# Patient Record
Sex: Female | Born: 1945 | Race: White | Hispanic: No | Marital: Single | Smoking: Never smoker
Health system: Southern US, Community
[De-identification: ages and names within clinical notes are randomized; demographics above are authoritative.]

## PROBLEM LIST (undated history)

## (undated) DIAGNOSIS — F1020 Alcohol dependence, uncomplicated: Secondary | ICD-10-CM

## (undated) DIAGNOSIS — J449 Chronic obstructive pulmonary disease, unspecified: Secondary | ICD-10-CM

## (undated) DIAGNOSIS — I10 Essential (primary) hypertension: Secondary | ICD-10-CM

## (undated) HISTORY — PX: LUNG LOBECTOMY: SHX167

---

## 2021-10-24 ENCOUNTER — Inpatient Hospital Stay (HOSPITAL_BASED_OUTPATIENT_CLINIC_OR_DEPARTMENT_OTHER)
Admission: EM | Admit: 2021-10-24 | Discharge: 2021-10-28 | DRG: 194 | Disposition: A | Discharge status: Home / Self Care | Payer: Medicare Other | Attending: Internal Medicine | Admitting: Internal Medicine

## 2021-10-24 ENCOUNTER — Other Ambulatory Visit: Payer: Self-pay

## 2021-10-24 ENCOUNTER — Emergency Department (HOSPITAL_BASED_OUTPATIENT_CLINIC_OR_DEPARTMENT_OTHER): Payer: Medicare Other

## 2021-10-24 ENCOUNTER — Encounter (HOSPITAL_BASED_OUTPATIENT_CLINIC_OR_DEPARTMENT_OTHER): Payer: Self-pay | Admitting: Emergency Medicine

## 2021-10-24 DIAGNOSIS — E860 Dehydration: Secondary | ICD-10-CM | POA: Diagnosis present

## 2021-10-24 DIAGNOSIS — E785 Hyperlipidemia, unspecified: Secondary | ICD-10-CM | POA: Diagnosis present

## 2021-10-24 DIAGNOSIS — E8729 Other acidosis: Secondary | ICD-10-CM | POA: Diagnosis present

## 2021-10-24 DIAGNOSIS — E876 Hypokalemia: Secondary | ICD-10-CM | POA: Diagnosis present

## 2021-10-24 DIAGNOSIS — R7401 Elevation of levels of liver transaminase levels: Secondary | ICD-10-CM

## 2021-10-24 DIAGNOSIS — R17 Unspecified jaundice: Secondary | ICD-10-CM

## 2021-10-24 DIAGNOSIS — R251 Tremor, unspecified: Secondary | ICD-10-CM | POA: Diagnosis present

## 2021-10-24 DIAGNOSIS — J189 Pneumonia, unspecified organism: Secondary | ICD-10-CM | POA: Diagnosis not present

## 2021-10-24 DIAGNOSIS — F39 Unspecified mood [affective] disorder: Secondary | ICD-10-CM | POA: Diagnosis present

## 2021-10-24 DIAGNOSIS — Z6823 Body mass index (BMI) 23.0-23.9, adult: Secondary | ICD-10-CM

## 2021-10-24 DIAGNOSIS — W19XXXA Unspecified fall, initial encounter: Secondary | ICD-10-CM

## 2021-10-24 DIAGNOSIS — F1010 Alcohol abuse, uncomplicated: Secondary | ICD-10-CM

## 2021-10-24 DIAGNOSIS — Z20822 Contact with and (suspected) exposure to covid-19: Secondary | ICD-10-CM | POA: Diagnosis present

## 2021-10-24 DIAGNOSIS — I10 Essential (primary) hypertension: Secondary | ICD-10-CM | POA: Diagnosis present

## 2021-10-24 DIAGNOSIS — Z885 Allergy status to narcotic agent status: Secondary | ICD-10-CM

## 2021-10-24 DIAGNOSIS — R0600 Dyspnea, unspecified: Secondary | ICD-10-CM | POA: Diagnosis present

## 2021-10-24 DIAGNOSIS — F1020 Alcohol dependence, uncomplicated: Secondary | ICD-10-CM | POA: Diagnosis present

## 2021-10-24 DIAGNOSIS — S0083XA Contusion of other part of head, initial encounter: Secondary | ICD-10-CM | POA: Diagnosis present

## 2021-10-24 DIAGNOSIS — Y900 Blood alcohol level of less than 20 mg/100 ml: Secondary | ICD-10-CM | POA: Diagnosis present

## 2021-10-24 DIAGNOSIS — R627 Adult failure to thrive: Secondary | ICD-10-CM | POA: Diagnosis present

## 2021-10-24 DIAGNOSIS — F10229 Alcohol dependence with intoxication, unspecified: Secondary | ICD-10-CM | POA: Diagnosis present

## 2021-10-24 DIAGNOSIS — Q2546 Tortuous aortic arch: Secondary | ICD-10-CM

## 2021-10-24 DIAGNOSIS — E538 Deficiency of other specified B group vitamins: Secondary | ICD-10-CM | POA: Diagnosis present

## 2021-10-24 DIAGNOSIS — W010XXA Fall on same level from slipping, tripping and stumbling without subsequent striking against object, initial encounter: Secondary | ICD-10-CM | POA: Diagnosis present

## 2021-10-24 DIAGNOSIS — D696 Thrombocytopenia, unspecified: Secondary | ICD-10-CM | POA: Diagnosis present

## 2021-10-24 DIAGNOSIS — Z902 Acquired absence of lung [part of]: Secondary | ICD-10-CM

## 2021-10-24 DIAGNOSIS — R0602 Shortness of breath: Secondary | ICD-10-CM

## 2021-10-24 DIAGNOSIS — E872 Acidosis, unspecified: Secondary | ICD-10-CM

## 2021-10-24 DIAGNOSIS — J440 Chronic obstructive pulmonary disease with acute lower respiratory infection: Secondary | ICD-10-CM | POA: Diagnosis present

## 2021-10-24 HISTORY — DX: Alcohol dependence, uncomplicated: F10.20

## 2021-10-24 HISTORY — DX: Essential (primary) hypertension: I10

## 2021-10-24 HISTORY — DX: Chronic obstructive pulmonary disease, unspecified: J44.9

## 2021-10-24 LAB — I-STAT VENOUS BLOOD GAS, ED
Acid-base deficit: 1 mmol/L (ref 0.0–2.0)
Bicarbonate: 19.2 mmol/L — ABNORMAL LOW (ref 20.0–28.0)
Calcium, Ion: 1.04 mmol/L — ABNORMAL LOW (ref 1.15–1.40)
HCT: 36 % (ref 36.0–46.0)
Hemoglobin: 12.2 g/dL (ref 12.0–15.0)
O2 Saturation: 98 %
Patient temperature: 97.8
Potassium: 3.1 mmol/L — ABNORMAL LOW (ref 3.5–5.1)
Sodium: 137 mmol/L (ref 135–145)
TCO2: 20 mmol/L — ABNORMAL LOW (ref 22–32)
pCO2, Ven: 20.3 mmHg — ABNORMAL LOW (ref 44.0–60.0)
pH, Ven: 7.582 — ABNORMAL HIGH (ref 7.250–7.430)
pO2, Ven: 85 mmHg — ABNORMAL HIGH (ref 32.0–45.0)

## 2021-10-24 LAB — URINALYSIS, MICROSCOPIC (REFLEX)

## 2021-10-24 LAB — COMPREHENSIVE METABOLIC PANEL
ALT: 36 U/L (ref 0–44)
AST: 125 U/L — ABNORMAL HIGH (ref 15–41)
Albumin: 3.9 g/dL (ref 3.5–5.0)
Alkaline Phosphatase: 223 U/L — ABNORMAL HIGH (ref 38–126)
Anion gap: 23 — ABNORMAL HIGH (ref 5–15)
BUN: 11 mg/dL (ref 8–23)
CO2: 17 mmol/L — ABNORMAL LOW (ref 22–32)
Calcium: 9 mg/dL (ref 8.9–10.3)
Chloride: 99 mmol/L (ref 98–111)
Creatinine, Ser: 0.51 mg/dL (ref 0.44–1.00)
GFR, Estimated: >60 mL/min (ref >60)
Glucose, Bld: 109 mg/dL — ABNORMAL HIGH (ref 70–99)
Potassium: 3.1 mmol/L — ABNORMAL LOW (ref 3.5–5.1)
Sodium: 139 mmol/L (ref 135–145)
Total Bilirubin: 2.6 mg/dL — ABNORMAL HIGH (ref 0.3–1.2)
Total Protein: 7.1 g/dL (ref 6.5–8.1)

## 2021-10-24 LAB — RESP PANEL BY RT-PCR (FLU A&B, COVID) ARPGX2
Influenza A by PCR: NEGATIVE
Influenza B by PCR: NEGATIVE
SARS Coronavirus 2 by RT PCR: NEGATIVE

## 2021-10-24 LAB — CBC WITH DIFFERENTIAL/PLATELET
Abs Immature Granulocytes: 0.03 10*3/uL (ref 0.00–0.07)
Basophils Absolute: 0 10*3/uL (ref 0.0–0.1)
Basophils Relative: 1 %
Eosinophils Absolute: 0 10*3/uL (ref 0.0–0.5)
Eosinophils Relative: 0 %
HCT: 37.3 % (ref 36.0–46.0)
Hemoglobin: 13.2 g/dL (ref 12.0–15.0)
Immature Granulocytes: 0 %
Lymphocytes Relative: 16 %
Lymphs Abs: 1.1 10*3/uL (ref 0.7–4.0)
MCH: 34 pg (ref 26.0–34.0)
MCHC: 35.4 g/dL (ref 30.0–36.0)
MCV: 96.1 fL (ref 80.0–100.0)
Monocytes Absolute: 0.3 10*3/uL (ref 0.1–1.0)
Monocytes Relative: 4 %
Neutro Abs: 5.6 10*3/uL (ref 1.7–7.7)
Neutrophils Relative %: 79 %
Platelets: 132 10*3/uL — ABNORMAL LOW (ref 150–400)
RBC: 3.88 MIL/uL (ref 3.87–5.11)
RDW: 12.6 % (ref 11.5–15.5)
WBC: 7.1 10*3/uL (ref 4.0–10.5)
nRBC: 0 % (ref 0.0–0.2)

## 2021-10-24 LAB — URINALYSIS, ROUTINE W REFLEX MICROSCOPIC
Bilirubin Urine: NEGATIVE
Glucose, UA: NEGATIVE mg/dL
Hgb urine dipstick: NEGATIVE
Ketones, ur: >=80 mg/dL — AB
Nitrite: NEGATIVE
Protein, ur: NEGATIVE mg/dL
Specific Gravity, Urine: >=1.03 (ref 1.005–1.030)
pH: 5.5 (ref 5.0–8.0)

## 2021-10-24 LAB — TROPONIN I (HIGH SENSITIVITY)
Troponin I (High Sensitivity): 5 ng/L (ref <18)
Troponin I (High Sensitivity): 7 ng/L (ref <18)

## 2021-10-24 LAB — LACTIC ACID, PLASMA
Lactic Acid, Venous: 6.1 mmol/L (ref 0.5–1.9)
Lactic Acid, Venous: 3.8 mmol/L (ref 0.5–1.9)

## 2021-10-24 LAB — BASIC METABOLIC PANEL
Anion gap: 17 — ABNORMAL HIGH (ref 5–15)
BUN: 10 mg/dL (ref 8–23)
CO2: 19 mmol/L — ABNORMAL LOW (ref 22–32)
Calcium: 8.8 mg/dL — ABNORMAL LOW (ref 8.9–10.3)
Chloride: 101 mmol/L (ref 98–111)
Creatinine, Ser: 0.5 mg/dL (ref 0.44–1.00)
GFR, Estimated: >60 mL/min (ref >60)
Glucose, Bld: 131 mg/dL — ABNORMAL HIGH (ref 70–99)
Potassium: 3.1 mmol/L — ABNORMAL LOW (ref 3.5–5.1)
Sodium: 137 mmol/L (ref 135–145)

## 2021-10-24 LAB — PROTIME-INR
INR: 1.1 (ref 0.8–1.2)
Prothrombin Time: 14 seconds (ref 11.4–15.2)

## 2021-10-24 LAB — ETHANOL: Alcohol, Ethyl (B): <10 mg/dL (ref <10)

## 2021-10-24 MED ORDER — ONDANSETRON HCL 4 MG/2ML IJ SOLN
INTRAMUSCULAR | Status: AC
  Filled 2021-10-24: qty 2

## 2021-10-24 MED ORDER — LACTATED RINGERS IV BOLUS
1000.0000 mL | Freq: Once | INTRAVENOUS | Status: AC
  Administered 2021-10-24: 1000 mL via INTRAVENOUS

## 2021-10-24 MED ORDER — ONDANSETRON HCL 4 MG/2ML IJ SOLN
4.0000 mg | Freq: Once | INTRAMUSCULAR | Status: AC
  Administered 2021-10-24: 4 mg via INTRAVENOUS

## 2021-10-24 MED ORDER — OXYCODONE-ACETAMINOPHEN 5-325 MG PO TABS
1.0000 | ORAL_TABLET | Freq: Once | ORAL | Status: AC
  Administered 2021-10-24: 1 via ORAL
  Filled 2021-10-24: qty 1

## 2021-10-24 MED ORDER — SODIUM CHLORIDE 0.9 % IV SOLN
500.0000 mg | Freq: Once | INTRAVENOUS | Status: AC
  Administered 2021-10-24: 500 mg via INTRAVENOUS
  Filled 2021-10-24: qty 500

## 2021-10-24 MED ORDER — SODIUM CHLORIDE 0.9 % IV SOLN
1.0000 g | Freq: Once | INTRAVENOUS | Status: AC
  Administered 2021-10-24: 1 g via INTRAVENOUS
  Filled 2021-10-24: qty 10

## 2021-10-24 MED ORDER — LORAZEPAM 2 MG/ML IJ SOLN
1.0000 mg | Freq: Once | INTRAMUSCULAR | Status: AC
  Administered 2021-10-24: 1 mg via INTRAVENOUS
  Filled 2021-10-24: qty 1

## 2021-10-24 NOTE — Progress Notes (Signed)
Patient states that she has an inhaler at home but that she has never used it and does not know what kind of inhaler it is.

## 2021-10-24 NOTE — ED Notes (Signed)
Pt transported to CT ?

## 2021-10-24 NOTE — ED Provider Notes (Signed)
[facility] EMERGENCY DEPARTMENT Provider Note   CSN: [id number] Arrival date & time: 10/24/21  1834     History Chief Complaint  Patient presents with   Chest Pain   Fall   Shortness of Breath    Brittany Randolph is a 75 y.o. female.  HPI     75 year old female with a history of COPD, hypertension, presents with concern for fall 2 weeks ago, with continued right-sided chest pain, and fatigue and decreased appetite.  Reports that she tripped in her bedroom slipper just before Thanksgiving and fell landing on her right side.  Denies any loss of consciousness or other injuries other than bruising to her face.  She had pain to the right side of her ribs since the fall.  Denies shortness of breath, reports food allergy cough.  Denies known fevers or chills.  Reports has not been eating  because of pain to the right side.  Family concerned that she had not been eating, not moving around.  Typically exercises but has not had classes this week and not sure she would want to go otherwise.  Reports that prior to coming to the emergency department she had not had headache, nausea or vomiting, but on upon arrival developed vomiting.  She is not having chest pain other than the chest pain on the right side.  She denies any lightheadedness or palpitations.  No syncope.  Denies history of known liver disease.  Reports that she does drink alcohol daily, she states 3 drinks a day, family feels it is likely more than that, and that she starts pretty early.  She she denies having prior withdrawal, family reports she does have withdrawal symptoms if she does not drink.  Denies takin Presenter, broadcasting aspirin/goody powders.  Past Medical History:  Diagnosis Date   COPD (chronic obstructive pulmonary disease) ([place])    Hypertension     Patient Active Problem List   Diagnosis Date Noted   CAP (community acquired pneumonia) 10/24/2021    Past Surgical History:  Procedure Laterality Date   LUNG  LOBECTOMY Right      OB History   No obstetric history on file.     History reviewed. No pertinent family history.  Social History   Tobacco Use   Smoking status: Never   Smokeless tobacco: Never    Home Medications Prior to Admission medications   Not on File    Allergies    Codeine  Review of Systems   Review of Systems  Constitutional:  Positive for activity change, appetite change and fatigue. Negative for fever.  HENT:  Negative for sore throat.   Eyes:  Negative for visual disturbance.  Respiratory:  Positive for cough. Negative for shortness of breath.   Cardiovascular:  Positive for chest pain.  Gastrointestinal:  Positive for nausea and vomiting. Negative for abdominal pain and diarrhea.  Genitourinary:  Negative for difficulty urinating.  Musculoskeletal:  Negative for back pain and neck pain.  Skin:  Negative for rash.  Neurological:  Negative for syncope and headaches.   Physical Exam Updated Vital Signs BP (!) 140/91   Pulse 87   Temp 97.8 F (36.6 C) (Oral)   Resp (!) 26   SpO2 100%   Physical Exam Vitals and nursing note reviewed.  Constitutional:      General: She is not in acute distress.    Appearance: She is well-developed. She is not diaphoretic.  HENT:     Head: Normocephalic.     Comments:  Right periorbital contusion  Eyes:     Conjunctiva/sclera: Conjunctivae normal.  Cardiovascular:     Rate and Rhythm: Normal rate and regular rhythm.     Heart sounds: Normal heart sounds. No murmur heard.   No friction rub. No gallop.  Pulmonary:     Effort: Pulmonary effort is normal. No respiratory distress.     Breath sounds: Examination of the right-middle field reveals rales. Examination of the right-lower field reveals rales. Rales present. No wheezing.  Chest:     Chest wall: Tenderness present.  Abdominal:     General: There is no distension.     Palpations: Abdomen is soft.     Tenderness: There is no abdominal tenderness. There  is no guarding.  Musculoskeletal:        General: No tenderness.     Cervical back: Normal range of motion.  Skin:    General: Skin is warm and dry.     Findings: No erythema or rash.  Neurological:     Mental Status: She is alert and oriented to person, place, and time.    ED Results / Procedures / Treatments   Labs (all labs ordered are listed, but only abnormal results are displayed) Labs Reviewed  CBC WITH DIFFERENTIAL/PLATELET - Abnormal; Notable for the following components:      Result Value   Platelets 132 (*)    All other components within normal limits  COMPREHENSIVE METABOLIC PANEL - Abnormal; Notable for the following components:   Potassium 3.1 (*)    CO2 17 (*)    Glucose, Bld 109 (*)    AST 125 (*)    Alkaline Phosphatase 223 (*)    Total Bilirubin 2.6 (*)    Anion gap 23 (*)    All other components within normal limits  LACTIC ACID, PLASMA - Abnormal; Notable for the following components:   Lactic Acid, Venous 6.1 (*)    All other components within normal limits  LACTIC ACID, PLASMA - Abnormal; Notable for the following components:   Lactic Acid, Venous 3.8 (*)    All other components within normal limits  URINALYSIS, ROUTINE W REFLEX MICROSCOPIC - Abnormal; Notable for the following components:   Ketones, ur >=80 (*)    Leukocytes,Ua TRACE (*)    All other components within normal limits  BASIC METABOLIC PANEL - Abnormal; Notable for the following components:   Potassium 3.1 (*)    CO2 19 (*)    Glucose, Bld 131 (*)    Calcium 8.8 (*)    Anion gap 17 (*)    All other components within normal limits  URINALYSIS, MICROSCOPIC (REFLEX) - Abnormal; Notable for the following components:   Bacteria, UA RARE (*)    All other components within normal limits  I-STAT VENOUS BLOOD GAS, ED - Abnormal; Notable for the following components:   pH, Ven 7.582 (*)    pCO2, Ven 20.3 (*)    pO2, Ven 85.0 (*)    Bicarbonate 19.2 (*)    TCO2 20 (*)    Potassium 3.1 (*)     Calcium, Ion 1.04 (*)    All other components within normal limits  RESP PANEL BY RT-PCR (FLU A&B, COVID) ARPGX2  CULTURE, BLOOD (ROUTINE X 2)  CULTURE, BLOOD (ROUTINE X 2)  ETHANOL  PROTIME-INR  TROPONIN I (HIGH SENSITIVITY)  TROPONIN I (HIGH SENSITIVITY)    EKG EKG Interpretation  Date/Time:  Saturday October 24 2021 19:40:54 EST Ventricular Rate:  91 PR Interval:  129  QRS Duration: 110 QT Interval:  392 QTC Calculation: 483 R Axis:   4 Text Interpretation: Sinus rhythm Abnormal R-wave progression, early transition Borderline repolarization abnormality No significant change since last tracing Confirmed by Gareth Morgan [phone]) on 10/24/2021 9:37:57 PM  Radiology DG Chest 2 View  Result Date: 10/24/2021 CLINICAL DATA:  Dyspnea, chest pain EXAM: CHEST - 2 VIEW COMPARISON:  None. FINDINGS: There is focal infiltrate within the right middle lobe, possibly infectious in the appropriate clinical setting. Minimal infiltrate may be present within the lingula. No pneumothorax or pleural effusion. Cardiac size within normal limits. 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 Pulmonary vascularity is normal. No acute bone abnormality. IMPRESSION: Right middle lobe pneumonic infiltrate. Follow-up radiograph is recommended in 3-4 weeks to document complete resolution and exclude the presence of a central obstructing lesion. Aneurysmal dilation of the thoracic aorta. This would better assessed with contrast enhanced CT examination if prior examinations are unavailable for comparison, if clinically indicated. Electronically Signed   By: Fidela Salisbury M.D.   On: 10/24/2021 19:39   CT Head Wo Contrast  Result Date: 10/24/2021 CLINICAL DATA:  Fall injury. EXAM: CT HEAD WITHOUT CONTRAST TECHNIQUE: Contiguous axial images were obtained from the base of the skull through the vertex without intravenous contrast. COMPARISON:  None. FINDINGS: Brain: There is mild cerebral  atrophy and atrophic ventriculomegaly with mild-to-moderate small vessel disease of the cerebral white matter and a few small chronic bilateral gangliocapsular lacunar infarcts. There is slight cerebellar atrophy. No asymmetry is seen concerning for acute infarct, hemorrhage or mass. There is no midline shift. Vascular: There are scattered calcifications in the carotid siphons but no hyperdense central vasculature. Additional calcification distal left vertebral artery. Skull: Normal aside from osteopenia. Negative for fracture or focal lesion. No scalp hematoma is visible. Sinuses/Orbits: No acute finding. Evidence of prior lens extractions. Right-sided deviation and spurring of the nasal septum. Other: None. IMPRESSION: No acute intracranial CT findings or depressed skull fractures. Chronic change. Electronically Signed   By: Telford Nab M.D.   On: 10/24/2021 20:34    Procedures Procedures   Medications Ordered in ED Medications  LORazepam (ATIVAN) injection 0-4 mg (has no administration in time range)    Or  LORazepam (ATIVAN) tablet 0-4 mg (has no administration in time range)  LORazepam (ATIVAN) injection 0-4 mg (has no administration in time range)    Or  LORazepam (ATIVAN) tablet 0-4 mg (has no administration in time range)  thiamine tablet 100 mg (has no administration in time range)    Or  thiamine (B-1) injection 100 mg (has no administration in time range)  lactated ringers bolus 1,000 mL (0 mLs Intravenous Stopped 10/24/21 2113)  oxyCODONE-acetaminophen (PERCOCET/ROXICET) 5-325 MG per tablet 1 tablet (1 tablet Oral Given 10/24/21 1955)  azithromycin (ZITHROMAX) 500 mg in sodium chloride 0.9 % 250 mL IVPB (0 mg Intravenous Stopped 10/24/21 2320)  cefTRIAXone (ROCEPHIN) 1 g in sodium chloride 0.9 % 100 mL IVPB (0 g Intravenous Stopped 10/24/21 2147)  lactated ringers bolus 1,000 mL (0 mLs Intravenous Stopped 10/24/21 2320)  ondansetron (ZOFRAN) injection 4 mg (4 mg Intravenous Given  10/24/21 2135)  LORazepam (ATIVAN) injection 1 mg (1 mg Intravenous Given 10/24/21 2247)    ED Course  I have reviewed the triage vital signs and the nursing notes.  Pertinent labs & imaging results that were available during my care of the patient were reviewed by me and considered in my medical decision making (see chart  for details).    MDM Rules/Calculators/A&P                             75 year old female with a history of COPD, hypertension, presents with concern for fall 2 weeks ago, with continued right-sided chest pain, and fatigue and decreased appetite.  Initial vital signs within normal limits.  Chest x-ray was completed which shows signs of right middle lobe pneumonia.  Labs are significant for a lactic acidosis with a lactate of 6.1, elevated bilirubin, thrombocytopenia, elevated transaminase and alk phos.  She has no known history of liver disease, but does report regular alcohol use.  Lab work is concerning for underlying liver disease, and wonder if this contributes to her significant lactic acidosis and absence of other vital sign abnormalities.  Labs significant for bicarb of 17, anion gap of 23--likely secondary to lactic acidosis, also consider alcoholic or starvation ketoacidosis in setting of alcohol use and not eating or drinking and ketones in urine. Lactate elevation may be secondary to dehydration, possible sepsis from pneumoina and likely underlying liver disease. Labs improving with IV fluids, lactate down to 3.8.  Developing tachypnea, anxiety, feeling warm, nausea, vomiting, sllight tremor-given ativan. Placed CIWA.    Ordered CT for further evaluation of question of obstructive mass and to evaluate for underlying rib fractures from fall 2 weeks ago.  Admitted for further care of pneumonia and acidosis.    Final Clinical Impression(s) / ED Diagnoses Final diagnoses:  Pneumonia of right middle lobe due to infectious organism  Lactic acidosis  Starvation  ketoacidosis  Dehydration  Elevated bilirubin  Transaminitis  Alcohol abuse  Fall, initial encounter    Rx / DC Orders ED Discharge Orders     None        Gareth Morgan, MD 10/25/21 [phone]

## 2021-10-24 NOTE — ED Triage Notes (Signed)
Pt c/o right sided rib pain and shob since a mechanical fall that occurred on 11/22. Pt has bruised right eye. Denies loc, n/v, dizziness after the fall. Denies blood thinners.

## 2021-10-24 NOTE — ED Notes (Signed)
2 sets of blood cultures collected prior to antibiotic administration. 

## 2021-10-24 NOTE — ED Notes (Signed)
Patient transported to CT 

## 2021-10-25 ENCOUNTER — Emergency Department (HOSPITAL_BASED_OUTPATIENT_CLINIC_OR_DEPARTMENT_OTHER): Payer: Medicare Other

## 2021-10-25 ENCOUNTER — Encounter (HOSPITAL_COMMUNITY): Payer: Self-pay | Admitting: Internal Medicine

## 2021-10-25 DIAGNOSIS — F39 Unspecified mood [affective] disorder: Secondary | ICD-10-CM | POA: Diagnosis present

## 2021-10-25 DIAGNOSIS — J189 Pneumonia, unspecified organism: Secondary | ICD-10-CM | POA: Insufficient documentation

## 2021-10-25 DIAGNOSIS — Z20822 Contact with and (suspected) exposure to covid-19: Secondary | ICD-10-CM | POA: Diagnosis present

## 2021-10-25 DIAGNOSIS — E860 Dehydration: Secondary | ICD-10-CM | POA: Diagnosis present

## 2021-10-25 DIAGNOSIS — F1020 Alcohol dependence, uncomplicated: Secondary | ICD-10-CM

## 2021-10-25 DIAGNOSIS — E538 Deficiency of other specified B group vitamins: Secondary | ICD-10-CM | POA: Diagnosis present

## 2021-10-25 DIAGNOSIS — F10229 Alcohol dependence with intoxication, unspecified: Secondary | ICD-10-CM | POA: Diagnosis present

## 2021-10-25 DIAGNOSIS — R0600 Dyspnea, unspecified: Secondary | ICD-10-CM

## 2021-10-25 DIAGNOSIS — R627 Adult failure to thrive: Secondary | ICD-10-CM | POA: Diagnosis present

## 2021-10-25 DIAGNOSIS — E8729 Other acidosis: Secondary | ICD-10-CM | POA: Diagnosis present

## 2021-10-25 DIAGNOSIS — Q2546 Tortuous aortic arch: Secondary | ICD-10-CM | POA: Diagnosis not present

## 2021-10-25 DIAGNOSIS — Z885 Allergy status to narcotic agent status: Secondary | ICD-10-CM | POA: Diagnosis not present

## 2021-10-25 DIAGNOSIS — Y900 Blood alcohol level of less than 20 mg/100 ml: Secondary | ICD-10-CM | POA: Diagnosis present

## 2021-10-25 DIAGNOSIS — D696 Thrombocytopenia, unspecified: Secondary | ICD-10-CM | POA: Diagnosis present

## 2021-10-25 DIAGNOSIS — J440 Chronic obstructive pulmonary disease with acute lower respiratory infection: Secondary | ICD-10-CM | POA: Diagnosis present

## 2021-10-25 DIAGNOSIS — I10 Essential (primary) hypertension: Secondary | ICD-10-CM

## 2021-10-25 DIAGNOSIS — E785 Hyperlipidemia, unspecified: Secondary | ICD-10-CM | POA: Diagnosis present

## 2021-10-25 DIAGNOSIS — R251 Tremor, unspecified: Secondary | ICD-10-CM | POA: Diagnosis present

## 2021-10-25 DIAGNOSIS — S0083XA Contusion of other part of head, initial encounter: Secondary | ICD-10-CM | POA: Diagnosis present

## 2021-10-25 DIAGNOSIS — F1010 Alcohol abuse, uncomplicated: Secondary | ICD-10-CM | POA: Diagnosis not present

## 2021-10-25 DIAGNOSIS — E876 Hypokalemia: Secondary | ICD-10-CM | POA: Diagnosis present

## 2021-10-25 DIAGNOSIS — Z902 Acquired absence of lung [part of]: Secondary | ICD-10-CM | POA: Diagnosis not present

## 2021-10-25 DIAGNOSIS — W010XXA Fall on same level from slipping, tripping and stumbling without subsequent striking against object, initial encounter: Secondary | ICD-10-CM | POA: Diagnosis present

## 2021-10-25 DIAGNOSIS — Z6823 Body mass index (BMI) 23.0-23.9, adult: Secondary | ICD-10-CM | POA: Diagnosis not present

## 2021-10-25 LAB — BLOOD CULTURE ID PANEL (REFLEXED) - BCID2

## 2021-10-25 LAB — LACTIC ACID, PLASMA
Lactic Acid, Venous: 1.4 mmol/L (ref 0.5–1.9)
Lactic Acid, Venous: 1 mmol/L (ref 0.5–1.9)

## 2021-10-25 MED ORDER — LORAZEPAM 1 MG PO TABS: 0.0000 mg | ORAL_TABLET | Freq: Two times a day (BID) | ORAL | Status: DC

## 2021-10-25 MED ORDER — ONDANSETRON HCL 4 MG PO TABS: 4.0000 mg | ORAL_TABLET | Freq: Four times a day (QID) | ORAL | Status: DC | PRN

## 2021-10-25 MED ORDER — CITALOPRAM HYDROBROMIDE 20 MG PO TABS
20.0000 mg | ORAL_TABLET | Freq: Every day | ORAL | Status: DC
  Administered 2021-10-25 – 2021-10-28 (×4): 20 mg via ORAL
  Filled 2021-10-25 (×4): qty 1

## 2021-10-25 MED ORDER — LORAZEPAM 1 MG PO TABS
1.0000 mg | ORAL_TABLET | Freq: Once | ORAL | Status: AC
  Administered 2021-10-25: 01:00:00 1 mg via ORAL
  Filled 2021-10-25: qty 1

## 2021-10-25 MED ORDER — ACETAMINOPHEN 650 MG RE SUPP: 650.0000 mg | Freq: Four times a day (QID) | RECTAL | Status: DC | PRN

## 2021-10-25 MED ORDER — LORAZEPAM 2 MG/ML IJ SOLN
1.0000 mg | Freq: Once | INTRAMUSCULAR | Status: AC
  Administered 2021-10-25: 05:00:00 1 mg via INTRAVENOUS
  Filled 2021-10-25: qty 1

## 2021-10-25 MED ORDER — POTASSIUM CHLORIDE 10 MEQ/100ML IV SOLN
10.0000 meq | INTRAVENOUS | Status: AC
  Administered 2021-10-25 (×3): 10 meq via INTRAVENOUS
  Filled 2021-10-25 (×3): qty 100

## 2021-10-25 MED ORDER — SODIUM CHLORIDE 0.9 % IV SOLN
500.0000 mg | Freq: Once | INTRAVENOUS | Status: AC
  Administered 2021-10-25: 20:00:00 500 mg via INTRAVENOUS
  Filled 2021-10-25: qty 500

## 2021-10-25 MED ORDER — SODIUM CHLORIDE 0.9% FLUSH
3.0000 mL | Freq: Two times a day (BID) | INTRAVENOUS | Status: DC
  Administered 2021-10-25 – 2021-10-27 (×5): 3 mL via INTRAVENOUS

## 2021-10-25 MED ORDER — ACETAMINOPHEN 325 MG PO TABS
650.0000 mg | ORAL_TABLET | Freq: Four times a day (QID) | ORAL | Status: DC | PRN
  Administered 2021-10-25 – 2021-10-27 (×2): 650 mg via ORAL
  Filled 2021-10-25 (×2): qty 2

## 2021-10-25 MED ORDER — ENOXAPARIN SODIUM 40 MG/0.4ML IJ SOSY
40.0000 mg | PREFILLED_SYRINGE | INTRAMUSCULAR | Status: DC
  Administered 2021-10-25 – 2021-10-27 (×3): 40 mg via SUBCUTANEOUS
  Filled 2021-10-25 (×3): qty 0.4

## 2021-10-25 MED ORDER — HYDRALAZINE HCL 20 MG/ML IJ SOLN: 5.0000 mg | INTRAMUSCULAR | Status: DC | PRN

## 2021-10-25 MED ORDER — POLYETHYLENE GLYCOL 3350 17 G PO PACK: 17.0000 g | PACK | Freq: Every day | ORAL | Status: DC | PRN

## 2021-10-25 MED ORDER — LACTATED RINGERS IV SOLN: INTRAVENOUS | Status: DC

## 2021-10-25 MED ORDER — SODIUM CHLORIDE 0.9 % IV SOLN: INTRAVENOUS | Status: DC | PRN

## 2021-10-25 MED ORDER — OXYCODONE HCL 5 MG PO TABS: 5.0000 mg | ORAL_TABLET | ORAL | Status: DC | PRN

## 2021-10-25 MED ORDER — GABAPENTIN 400 MG PO CAPS
400.0000 mg | ORAL_CAPSULE | Freq: Three times a day (TID) | ORAL | Status: DC
  Administered 2021-10-25 – 2021-10-28 (×9): 400 mg via ORAL
  Filled 2021-10-25 (×8): qty 1

## 2021-10-25 MED ORDER — ALBUTEROL SULFATE (2.5 MG/3ML) 0.083% IN NEBU: 2.5000 mg | INHALATION_SOLUTION | RESPIRATORY_TRACT | Status: DC | PRN

## 2021-10-25 MED ORDER — PRAVASTATIN SODIUM 10 MG PO TABS
20.0000 mg | ORAL_TABLET | Freq: Every day | ORAL | Status: DC
  Administered 2021-10-25 – 2021-10-28 (×4): 20 mg via ORAL
  Filled 2021-10-25 (×4): qty 2

## 2021-10-25 MED ORDER — QUETIAPINE FUMARATE 100 MG PO TABS
100.0000 mg | ORAL_TABLET | Freq: Every day | ORAL | Status: DC
  Administered 2021-10-25 – 2021-10-28 (×4): 100 mg via ORAL
  Filled 2021-10-25 (×4): qty 1

## 2021-10-25 MED ORDER — MORPHINE SULFATE (PF) 2 MG/ML IV SOLN
2.0000 mg | INTRAVENOUS | Status: DC | PRN
  Administered 2021-10-25: 12:00:00 2 mg via INTRAVENOUS
  Filled 2021-10-25: qty 1

## 2021-10-25 MED ORDER — LORAZEPAM 1 MG PO TABS
0.0000 mg | ORAL_TABLET | Freq: Four times a day (QID) | ORAL | Status: AC
  Filled 2021-10-25: qty 4

## 2021-10-25 MED ORDER — AMLODIPINE BESYLATE 5 MG PO TABS
5.0000 mg | ORAL_TABLET | Freq: Every day | ORAL | Status: DC
  Administered 2021-10-25 – 2021-10-26 (×2): 5 mg via ORAL
  Filled 2021-10-25 (×2): qty 1

## 2021-10-25 MED ORDER — LORAZEPAM 2 MG/ML IJ SOLN: 0.0000 mg | Freq: Four times a day (QID) | INTRAMUSCULAR | Status: AC

## 2021-10-25 MED ORDER — SODIUM CHLORIDE 0.9 % IV SOLN
2.0000 g | INTRAVENOUS | Status: DC
  Administered 2021-10-25: 20:00:00 2 g via INTRAVENOUS
  Filled 2021-10-25: qty 20

## 2021-10-25 MED ORDER — ASPIRIN EC 81 MG PO TBEC
81.0000 mg | DELAYED_RELEASE_TABLET | Freq: Every day | ORAL | Status: DC
  Administered 2021-10-25 – 2021-10-28 (×4): 81 mg via ORAL
  Filled 2021-10-25 (×4): qty 1

## 2021-10-25 MED ORDER — BISACODYL 5 MG PO TBEC: 5.0000 mg | DELAYED_RELEASE_TABLET | Freq: Every day | ORAL | Status: DC | PRN

## 2021-10-25 MED ORDER — THIAMINE HCL 100 MG/ML IJ SOLN
100.0000 mg | Freq: Every day | INTRAMUSCULAR | Status: DC
  Filled 2021-10-25: qty 2

## 2021-10-25 MED ORDER — IOHEXOL 300 MG/ML  SOLN
100.0000 mL | Freq: Once | INTRAMUSCULAR | Status: AC | PRN
  Administered 2021-10-25: 01:00:00 100 mL via INTRAVENOUS

## 2021-10-25 MED ORDER — ONDANSETRON HCL 4 MG/2ML IJ SOLN: 4.0000 mg | Freq: Four times a day (QID) | INTRAMUSCULAR | Status: DC | PRN

## 2021-10-25 MED ORDER — TRAZODONE HCL 50 MG PO TABS
50.0000 mg | ORAL_TABLET | Freq: Every evening | ORAL | Status: DC | PRN
  Administered 2021-10-25 – 2021-10-27 (×3): 50 mg via ORAL
  Filled 2021-10-25 (×3): qty 1

## 2021-10-25 MED ORDER — DOCUSATE SODIUM 100 MG PO CAPS
100.0000 mg | ORAL_CAPSULE | Freq: Two times a day (BID) | ORAL | Status: DC
  Administered 2021-10-25 – 2021-10-28 (×6): 100 mg via ORAL
  Filled 2021-10-25 (×6): qty 1

## 2021-10-25 MED ORDER — THIAMINE HCL 100 MG PO TABS
100.0000 mg | ORAL_TABLET | Freq: Every day | ORAL | Status: DC
  Administered 2021-10-25 – 2021-10-28 (×4): 100 mg via ORAL
  Filled 2021-10-25 (×4): qty 1

## 2021-10-25 MED ORDER — LORAZEPAM 2 MG/ML IJ SOLN: 0.0000 mg | Freq: Two times a day (BID) | INTRAMUSCULAR | Status: DC

## 2021-10-25 NOTE — ED Notes (Signed)
Updated pt's daughter, Lyla Son, via phone, that pt is on the way to [facility]. Pt's daughter expressed understanding.

## 2021-10-25 NOTE — H&P (Signed)
History and Physical    Brittany Randolph ZOX:[id number] DOB: Aug 22, 1946 DOA: 10/24/2021  PCP: Brittany Randolph Consultants:  None Patient coming from:  Home - lives alone; NOK:  Daughter, Brittany Randolph, [phone]  Chief Complaint: SOB  HPI: Brittany Randolph is a 75 y.o. female with medical history significant of COPD; HTN; and ETOH dependence presenting to [facility] with SOB. She reports that around Thanksgiving, she tripped over a slipper in the floor and hit her R eye on the corner of the end table.  She also hit her R chest on a stool.  Her family noticed that she had little appetite over Thanksgiving and she has been feeling poorly since then.  She has reported h/o "COPD" but has never smoked (lots of lifelong 2nd hand exposure).  She has chronic SOB but this is unchanged from her baseline. She does acknowledge ETOH intake almost every night but has difficulty quantifying it - says she drinks a few glasses of boxed wine vs. A margarita or bloody mary.  I spoke with her daughter and she has been "out of it a little bit."  When she fell, she hit her eye and ribs.  She had a negative COVID test.  She has just been really tired.  She doesn't breathe well normally at baseline due to COPD, maybe somewhat worse since the fall.  She drinks every day, more than a few drinks a day.    ED Course: [facility] to [facility] transfer, per Dr. Rachael Randolph:  75 yo female with SOB. Has pneumonia and put on antibiotics. Unclear history of alcohol use but had some tremors and given ativan. Family reported to ER MD that pt drinks 3-4 ETOH drinks a day and sometimes doesn't feel good when stops drinking. Will need CIWA ordered. Had fall at home. CT head negative. Intial lactic acid high but improved with IVF hydration. Vitals stable and no hypotension. Accepted to progressive care to be watched closely with unknown history of withdrawl  Review of Systems: As per HPI; otherwise review of systems reviewed and negative.   Ambulatory Status:   Ambulates without assistance  COVID Vaccine Status:  Unknown  Past Medical History:  Diagnosis Date   Alcohol dependence (HCC)    COPD (chronic obstructive pulmonary disease) (HCC)    Hypertension     Past Surgical History:  Procedure Laterality Date   LUNG LOBECTOMY Right     Social History   Socioeconomic History   Marital status: Single    Spouse name: Not on file   Number of children: Not on file   Years of education: Not on file   Highest education level: Not on file  Occupational History   Occupation: retired Child psychotherapist  Tobacco Use   Smoking status: Never   Smokeless tobacco: Never  Substance and Sexual Activity   Alcohol use: Yes    Comment: 3+ daily   Drug use: Never   Sexual activity: Not on file  Other Topics Concern   Not on file  Social History Narrative   Not on file   Social Determinants of Health   Financial Resource Strain: Not on file  Food Insecurity: Not on file  Transportation Needs: Not on file  Physical Activity: Not on file  Stress: Not on file  Social Connections: Not on file  Intimate Partner Violence: Not on file    Allergies  Allergen Reactions   Codeine Nausea And Vomiting    History reviewed. No pertinent family history.  Prior to Admission medications  Not on File    Physical Exam: Vitals:   10/25/21 0700 10/25/21 0730 10/25/21 0905 10/25/21 1215  BP: (!) 148/83 (!) 151/83 (!) 151/91 (!) 146/99  Pulse: 83 81 (!) 101 85  Resp: 16 19 16 18   Temp: 98 F (36.7 C)  97.9 F (36.6 C) 97.9 F (36.6 C)  TempSrc: Oral  Oral Oral  SpO2: 96% 94% 96% 93%     General:  Appears calm and comfortable and is in NAD Eyes:  PERRL, EOMI, normal lids, iris; marked R periorbital ecchymosis, particularly below the eye ENT:  hard of hearing, grossly normal lips & tongue, mmm; some absent dentition Neck:  no LAD, masses or thyromegaly Cardiovascular:  RRR, no m/r/g. No LE edema.  Respiratory:   Diminished R-sided breath sounds  diffusely without frank rhonchi.  Normal respiratory effort.  On RA. Abdomen:  soft, NT, ND Back:   normal alignment, no CVAT Skin:  no rash or induration seen on limited exam Musculoskeletal:  grossly normal tone BUE/BLE, good ROM, no bony abnormality Psychiatric:  blunted mood and affect, speech fluent and appropriate, AOx3 Neurologic:  CN 2-12 grossly intact, moves all extremities in coordinated fashion    Radiological Exams on Admission: Independently reviewed - see discussion in A/P where applicable  DG Chest 2 View  Result Date: 10/24/2021 CLINICAL DATA:  Dyspnea, chest pain EXAM: CHEST - 2 VIEW COMPARISON:  None. FINDINGS: There is focal infiltrate within the right middle lobe, possibly infectious in the appropriate clinical setting. Minimal infiltrate may be present within the lingula. No pneumothorax or pleural effusion. Cardiac size within normal limits. 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 Pulmonary vascularity is normal. No acute bone abnormality. IMPRESSION: Right middle lobe pneumonic infiltrate. Follow-up radiograph is recommended in 3-4 weeks to document complete resolution and exclude the presence of a central obstructing lesion. Aneurysmal dilation of the thoracic aorta. This would better assessed with contrast enhanced CT examination if prior examinations are unavailable for comparison, if clinically indicated. Electronically Signed   By: 14/01/2021 M.D.   On: 10/24/2021 19:39   CT Head Wo Contrast  Result Date: 10/24/2021 CLINICAL DATA:  Fall injury. EXAM: CT HEAD WITHOUT CONTRAST TECHNIQUE: Contiguous axial images were obtained from the base of the skull through the vertex without intravenous contrast. COMPARISON:  None. FINDINGS: Brain: There is mild cerebral atrophy and atrophic ventriculomegaly with mild-to-moderate small vessel disease of the cerebral white matter and a few small chronic bilateral gangliocapsular lacunar infarcts.  There is slight cerebellar atrophy. No asymmetry is seen concerning for acute infarct, hemorrhage or mass. There is no midline shift. Vascular: There are scattered calcifications in the carotid siphons but no hyperdense central vasculature. Additional calcification distal left vertebral artery. Skull: Normal aside from osteopenia. Negative for fracture or focal lesion. No scalp hematoma is visible. Sinuses/Orbits: No acute finding. Evidence of prior lens extractions. Right-sided deviation and spurring of the nasal septum. Other: None. IMPRESSION: No acute intracranial CT findings or depressed skull fractures. Chronic change. Electronically Signed   By: 14/01/2021 M.D.   On: 10/24/2021 20:34   CT Chest W Contrast  Result Date: 10/25/2021 CLINICAL DATA:  Dyspnea and chest pain, suspected pneumonia on chest x-ray, with prominent aortic configuration on chest x-ray. Also recent fall injury in last 2 weeks. EXAM: CT CHEST WITH CONTRAST TECHNIQUE: Multidetector CT imaging of the chest was performed during intravenous contrast administration. CONTRAST:  14/02/2021 OMNIPAQUE IOHEXOL 300 MG/ML  SOLN COMPARISON:  PA and lateral chest yesterday. FINDINGS: Cardiovascular: The heart is upper limits of normal in size. There are minimal to mild scattered three-vessel coronary artery calcifications with no pericardial effusion no venous distention. There is a prominent pulmonary trunk measuring 3.6 cm indicating arterial hypertension but no findings of acute right heart strain. No central embolus is seen but arterial opacification distal to the hila is very limited due to bolus timing. The aorta is nonaneurysmal but is moderately tortuous. There are mild scattered calcific plaques. The aortic root is 3.1 cm, ascending aorta is 3.2 cm, the arch measures up to 3.1 cm and the descending segment 2.9 cm. There is no dissection no great vessel stenosis. Mediastinum/Nodes: There are multiple calcified lymph nodes in the right-sided  subcarinal space and right hilum. A single mildly prominent right paratracheal space lymph node measures 1.6 x 1.1 cm and there is no further noncalcified intrathoracic adenopathy. No axillary or thyroid mass is seen. Unremarkable thoracic esophagus. Lungs/Pleura: Right chest volume loss and mild right mediastinal shift is present with cystic fibrotic changes with relative peripheral sparing in the right lower lobe, with bronchiectasis, with individual cystic spaces separated by slightly thickened septa and the cysts ranging from few mm up to 1.2 cm in size. In the right middle lobe there coarse chronic interstitial changes and additional volume loss, without the cystic component seen in the lower lobe. There is scattered ground-glass opacity in the right middle and lower lobes which is probably part of the chronic scarring process with resolving pneumonitis also possible. In the right upper lobe apex there are multiple scattered interstitial micronodules primarily in the periphery. There is reticulated scarring at the extreme apex. Left lung is hyperinflated and clear. The central airways are patent. No pleural effusion is seen. Upper Abdomen: Liver is moderately steatotic. The gallbladder is distended without appreciable focal abnormality of the visualized portion. Musculoskeletal: There are degenerative changes and mild kyphosis of the thoracic spine. IMPRESSION: 1. Prominent pulmonary trunk. No central embolus is seen but contrast opacification very limited distal to the hila due to bolus timing. No evidence for acute right heart strain. 2. Moderately tortuous aorta but no aneurysm or dissection. Aortic atherosclerosis. 3. 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 4. Volume loss of the right lung with cystic fibrotic process and bronchiectasis with relative peripheral sparing in the right lower lobe, coarse chronic interstitial changes and additional volume loss in the right middle lobe, and scattered ground-glass  opacities in the right middle and lower lobes which could be an active pneumonitis component or part of the fibrotic change. This is probably due to an old granulomatous infection given the numerous calcified right hilar nodes, and / or a chronic inflammatory process. Interstitial micronodularity in the right upper lobe also is likely chronic, but age indeterminate without prior studies. A three-month follow-up chest CT is recommended to ensure stability. 5. Hyperinflated but otherwise clear left lung. 6. Moderate liver steatosis. 7. Distended gallbladder, incompletely visualized but no wall thickening where visible. Electronically Signed   By: Almira Bar M.D.   On: 10/25/2021 01:42    EKG: Independently reviewed.  NSR with rate 97; PVCs; nonspecific ST changes with no evidence of acute ischemia   Labs on Admission: I have personally reviewed the available labs and imaging studies at the time of the admission.  Pertinent labs:   VBG: 7.585/20.3/19.2 K+ 3.1 Glucose 131 Anion gap 17 HS troponin 5, 7 Lactate 6.1, 3.8 Unremarkable CBC INR 1.1 COVID/flu negative UA: >80 ketones,  rare bacteria ETOH <10   Assessment/Plan Principal Problem:   Dyspnea Active Problems:   Essential hypertension   Alcohol dependence (HCC)   Failure to thrive in adult   Dyspnea -Patient with prior diagnosis of "COPD" but no personal smoking history -She also had a remote lobectomy -She presented mostly with decreased appetite - patient and family both report vague symptoms of anorexia and so they suspected PNA -Initial CXR with RML infiltrate -CT was then performed and appears to show more chronic disease, possibly chronic aspiration (perhaps associated with ETOH dependence) -She has vague pulm symptoms with SOB that is not clearly worse than her baseline -She is on RA -Patient was d/w Dr. Everardo All, who reviewed the CT -She will need f/u CT in 3 months -Will treat with short course of abx for now (on  Rocephin/Azithro while inpatient) -There is no clear indication for pulm f/u unless desired by patient/family, but Dr. George Hugh office will call to offer an appointment to ensure she doesn't get lost to f/u  Failure to thrive -It appears that patient/family are more concerned about FTT -She is not eating and appears to feel poorly -This may be associated with her ETOH dependence (see below) -Will request PT/OT/ST/nutrition evaluations  ETOH dependence -Patient with chronic ETOH dependence -She is vague in her reporting -Initial elevated lactate is thought to be related to ETOH rather than sepsis; will continue to trend until it normalizes -She is at increased for complications of withdrawal including seizures, DTs -Will admit to telemetry at this time -CIWA protocol -Folate, thiamine, and MVI ordered -Will provide symptom-triggered BZD (ativan per CIWA protocol) only since the patient is able to communicate; is not showing current signs of delirium; and has no history of severe withdrawal. -TOC team consult for education -Consider offering a medication for Alcohol Use Disorder at the time of d/c, to include Disulfuram; Naltrexone; or Acamprosate.  HTN - Continue Norvasc  Mood d/o -Continue Celexa, Seroquel, Neurontin  HLD -Continue pravastatin     Note: This patient has been tested and is negative for the novel coronavirus COVID-19.    Level of care: Telemetry Medical DVT prophylaxis:  Lovenox  Code Status:  Full - confirmed with patient Family Communication: None present; I spoke with the patient's daughter by telephone at the time of admission. Disposition Plan:  The patient is from: home  Anticipated d/c is to: home without [facility] services   Anticipated d/c date will depend on clinical response to treatment, but possibly as early as tomorrow if she has excellent response to treatment  Patient is currently: acutely ill Consults called: PT/OT/Nutrition  Admission status:   Admit - It is my clinical opinion that admission to INPATIENT is reasonable and necessary because of the expectation that this patient will require hospital care that crosses at least 2 midnights to treat this condition based on the medical complexity of the problems presented.  Given the aforementioned information, the predictability of an adverse outcome is felt to be significant.    Jonah Blue MD [facility]   How to contact the [facility] Attending or Consulting provider 7A - 7P or covering provider during after hours 7P -7A, for this patient?  Check the care team in [facility] and look for a) attending/consulting TR[facility] provider listed and b) the [facility] team listed Log into [email or web address] and use [facility] universal password to access. If you do not have the password, please contact the hospital operator. Locate the [facility] provider you are looking for under [facility] and page to a number that you can be directly reached. If you still have difficulty reaching the provider, please page the [facility] (Director on Call) for the Hospitalists listed on amion for assistance.   10/25/2021, 2:12 PM

## 2021-10-25 NOTE — Progress Notes (Signed)
PHARMACY - PHYSICIAN COMMUNICATION CRITICAL VALUE ALERT - BLOOD CULTURE IDENTIFICATION (BCID)  Brittany Randolph is an 75 y.o. female who presented to [facility] on 10/24/2021 with a chief complaint of SOB  Assessment:  Blood cultures with 1 of 4 bottles with GPC - BCID identified as staph species, no resistance detected  Name of physician (or Provider) Contacted: Ophelia Charter, MD  Current antibiotics: Azithromycin, ceftriaxone  Changes to prescribed antibiotics recommended:  Patient is on recommended antibiotics - No changes needed, Blood culture likely contaminant - monitor for now.  Results for orders placed or performed during the hospital encounter of 10/24/21  Blood Culture ID Panel (Reflexed) (Collected: 10/24/2021  9:00 PM)  Result Value Ref Range   Enterococcus faecalis NOT DETECTED NOT DETECTED   Enterococcus Faecium NOT DETECTED NOT DETECTED   Listeria monocytogenes NOT DETECTED NOT DETECTED   Staphylococcus species DETECTED (A) NOT DETECTED   Staphylococcus aureus (BCID) NOT DETECTED NOT DETECTED   Staphylococcus epidermidis NOT DETECTED NOT DETECTED   Staphylococcus lugdunensis NOT DETECTED NOT DETECTED   Streptococcus species NOT DETECTED NOT DETECTED   Streptococcus agalactiae NOT DETECTED NOT DETECTED   Streptococcus pneumoniae NOT DETECTED NOT DETECTED   Streptococcus pyogenes NOT DETECTED NOT DETECTED   A.calcoaceticus-baumannii NOT DETECTED NOT DETECTED   Bacteroides fragilis NOT DETECTED NOT DETECTED   Enterobacterales NOT DETECTED NOT DETECTED   Enterobacter cloacae complex NOT DETECTED NOT DETECTED   Escherichia coli NOT DETECTED NOT DETECTED   Klebsiella aerogenes NOT DETECTED NOT DETECTED   Klebsiella oxytoca NOT DETECTED NOT DETECTED   Klebsiella pneumoniae NOT DETECTED NOT DETECTED   Proteus species NOT DETECTED NOT DETECTED   Salmonella species NOT DETECTED NOT DETECTED   Serratia marcescens NOT DETECTED NOT DETECTED   Haemophilus influenzae NOT DETECTED NOT  DETECTED   Neisseria meningitidis NOT DETECTED NOT DETECTED   Pseudomonas aeruginosa NOT DETECTED NOT DETECTED   Stenotrophomonas maltophilia NOT DETECTED NOT DETECTED   Candida albicans NOT DETECTED NOT DETECTED   Candida auris NOT DETECTED NOT DETECTED   Candida glabrata NOT DETECTED NOT DETECTED   Candida krusei NOT DETECTED NOT DETECTED   Candida parapsilosis NOT DETECTED NOT DETECTED   Candida tropicalis NOT DETECTED NOT DETECTED   Cryptococcus neoformans/gattii NOT DETECTED NOT DETECTED    Tad Moore, BCPS, BCCP Clinical Pharmacist  10/25/2021 6:08 PM   [facility] pharmacy phone numbers are listed on [email or web address]

## 2021-10-25 NOTE — Evaluation (Signed)
Clinical/Bedside Swallow Evaluation Patient Details  Name: Brittany Randolph MRN: [id number] Date of Birth: [birth year]  Today's Date: 10/25/2021 Time: SLP Start Time (ACUTE ONLY): 1243 SLP Stop Time (ACUTE ONLY): 1255 SLP Time Calculation (min) (ACUTE ONLY): 12 min  Past Medical History:  Past Medical History:  Diagnosis Date   COPD (chronic obstructive pulmonary disease) (HCC)    Hypertension    Past Surgical History:  Past Surgical History:  Procedure Laterality Date   LUNG LOBECTOMY Right    HPI:  Pt is a 75 year old female who presented with concern for fall 2 weeks ago, with continued right-sided chest pain, fatigue and decreased appetite. Pt denied headache, nausea or vomiting prior to admisison, but developed vomiting on arrival. CT chest 12/4: Volume loss of the right lung with cystic fibrotic process and bronchiectasis with relative peripheral sparing in the right lower lobe, coarse chronic interstitial changes and additional volume loss  in the right middle lobe, and scattered ground-glass opacities in the right middle and lower lobes which could be an active pneumonitis component or part of the fibrotic change. PMH: COPD, hypertension    Assessment / Plan / Recommendation  Clinical Impression  Pt was seen for bedside swallow evaluation and she denied a history of dysphagia. Oral mechanism exam was [facility] and dentition was adequate. She exhibited throat clearing following intake of puree. Her swallow mechanism otherwise appeared WNL, but trials were limited per pt's request. A regular texture diet with thin liquids is recommended at this time and SLP will follow briefly to ensure diet tolerance. SLP Visit Diagnosis: Dysphagia, unspecified (R13.10)    Aspiration Risk  No limitations    Diet Recommendation Regular;Thin liquid   Liquid Administration via: Cup;Straw Medication Administration: Whole meds with liquid (or whole with puree) Supervision: Patient able to self feed Postural  Changes: Seated upright at 90 degrees    Other  Recommendations Oral Care Recommendations: Oral care BID    Recommendations for follow up therapy are one component of a multi-disciplinary discharge planning process, led by the attending physician.  Recommendations may be updated based on patient status, additional functional criteria and insurance authorization.  Follow up Recommendations No SLP follow up      Assistance Recommended at Discharge    Functional Status Assessment Patient has not had a recent decline in their functional status  Frequency and Duration min 1 x/week  1 week       Prognosis        Swallow Study   General Date of Onset: 10/24/21 HPI: Pt is a 75 year old female who presented with concern for fall 2 weeks ago, with continued right-sided chest pain, fatigue and decreased appetite. Pt denied headache, nausea or vomiting prior to admisison, but developed vomiting on arrival. CT chest 12/4: Volume loss of the right lung with cystic fibrotic process and bronchiectasis with relative peripheral sparing in the right lower lobe, coarse chronic interstitial changes and additional volume loss  in the right middle lobe, and scattered ground-glass opacities in the right middle and lower lobes which could be an active pneumonitis component or part of the fibrotic change. PMH: COPD, hypertension Type of Study: Bedside Swallow Evaluation Previous Swallow Assessment: none Diet Prior to this Study: Regular;Thin liquids Temperature Spikes Noted: No Respiratory Status: Room air History of Recent Intubation: No Behavior/Cognition: Alert;Cooperative;Pleasant mood Oral Cavity Assessment: Within Functional Limits Oral Care Completed by SLP: No Oral Cavity - Dentition: Adequate natural dentition Vision: Functional for self-feeding Self-Feeding Abilities: Able to feed self Patient  Positioning: Upright in bed;Postural control adequate for testing Baseline Vocal Quality:  Normal Volitional Cough: Strong Volitional Swallow: Able to elicit    Oral/Motor/Sensory Function Overall Oral Motor/Sensory Function: Within functional limits   Ice Chips Ice chips: Within functional limits Presentation: Spoon   Thin Liquid Thin Liquid: Within functional limits Presentation: Straw    Nectar Thick Nectar Thick Liquid: Not tested   Honey Thick Honey Thick Liquid: Not tested   Puree Puree: Impaired Presentation: Spoon Pharyngeal Phase Impairments: Throat Clearing - Immediate   Solid     Solid: Within functional limits Presentation: Self Fed     Brittany Marchena I. Vear Clock, MS, CCC-SLP Acute Rehabilitation Services Office number [phone] Pager [phone]   Brittany Randolph 10/25/2021,1:46 PM

## 2021-10-25 NOTE — Evaluation (Signed)
Physical Therapy Evaluation Patient Details Name: Brittany Randolph MRN: [id number] DOB: [birth year] Today's Date: 10/25/2021  History of Present Illness  75yo female who presented on 10/24/21 with SOB, also reports a fall at home around thanksgiving. Covid negative. CTH negative for acute changes. PMH EtOH dependence, COPD, HTN, lung lobectomy  Clinical Impression   Received in bed, very pleasant and cooperative; tells me she is usually quite active between silver sneakers, line dancing, and one additional exercise class during the week. She fell recently because she was walking in the dark as she did not want to turn on the light and wake up her dogs, and tripped. Able to mobilize on a Mod(I) to min guard basis today; initially unsteady in the hallway but able to self correct and this improved dramatically the further we walked today. SPO2 95-97% on room air, was a bit short of breath but does have COPD as well as hx of lobectomy. Left up in recliner with all needs met, lab staff present and attending. I expect that she will progress rapidly with PT as medical status improves, may not be a bad idea to get some OP PT f/u given recent hx of major fall however.        Recommendations for follow up therapy are one component of a multi-disciplinary discharge planning process, led by the attending physician.  Recommendations may be updated based on patient status, additional functional criteria and insurance authorization.  Follow Up Recommendations Outpatient PT    Assistance Recommended at Discharge PRN  Functional Status Assessment Patient has had a recent decline in their functional status and demonstrates the ability to make significant improvements in function in a reasonable and predictable amount of time.  Equipment Recommendations  None recommended by PT    Recommendations for Other Services       Precautions / Restrictions Precautions Precautions: Fall Restrictions Weight Bearing  Restrictions: No      Mobility  Bed Mobility Overal bed mobility: Modified Independent             General bed mobility comments: HOB elevated, increased time and effort    Transfers Overall transfer level: Needs assistance Equipment used: None Transfers: Sit to/from Stand Sit to Stand: Supervision           General transfer comment: S for safety/line management, no physical assist given    Ambulation/Gait Ambulation/Gait assistance: Min guard Gait Distance (Feet): 100 Feet Assistive device: None Gait Pattern/deviations: Step-through pattern;Drifts right/left;Trunk flexed;Trendelenburg Gait velocity: decreased     General Gait Details: mildly unsteady and with moderate drift at first, but this improved quite a bit with ongoing gait distance; SOB but SpO2 95-97% with activity on RA  Stairs            Wheelchair Mobility    Modified Rankin (Stroke Patients Only)       Balance Overall balance assessment: Needs assistance;History of Falls Sitting-balance support: Feet supported;No upper extremity supported Sitting balance-Leahy Scale: Good     Standing balance support: No upper extremity supported;During functional activity Standing balance-Leahy Scale: Good Standing balance comment: initially had R/L drift but the further she walked, the more this improved                             Pertinent Vitals/Pain Pain Assessment: 0-10 Pain Score: 5  Pain Location: R side of trunk with deep breaths Pain Descriptors / Indicators: Discomfort Pain Intervention(s): Limited activity within patient's  tolerance;Monitored during session    [place] expects to be discharged to:: Private residence Living Arrangements: Alone Available Help at Discharge: Family;Available PRN/intermittently;Friend(s)   Home Access: Stairs to enter   Entrance Stairs-Number of Steps: 2 STE no rails in the back;  front has about 7 steps with rails but  she rarely uses front entrance   Home Layout: One level Home Equipment: None Additional Comments: had a recent fall- stumbled on slippers in the middle of the night coming back from the bathroom    Prior Function Prior Level of Function : Independent/Modified Independent;Driving             Mobility Comments: manages own meds/bills       Hand Dominance        Extremity/Trunk Assessment   Upper Extremity Assessment Upper Extremity Assessment: Generalized weakness    Lower Extremity Assessment Lower Extremity Assessment: Generalized weakness    Cervical / Trunk Assessment Cervical / Trunk Assessment: Kyphotic  Communication   Communication: No difficulties  Cognition Arousal/Alertness: Awake/alert Behavior During Therapy: WFL for tasks assessed/performed Overall Cognitive Status: Within Functional Limits for tasks assessed                                   Functional Status Assessment: Patient has not had a recent decline in their functional status      General Comments General comments (skin integrity, edema, etc.): SpO2 95-97% on RA    Exercises     Assessment/Plan    PT Assessment Patient needs continued PT services  PT Problem List Decreased strength;Decreased activity tolerance;Decreased safety awareness;Decreased balance;Decreased mobility;Pain       PT Treatment Interventions DME instruction;Balance training;Gait training;Stair training;Functional mobility training;Patient/family education;Therapeutic activities;Therapeutic exercise    PT Goals (Current goals can be found in the Care Plan section)  Acute Rehab PT Goals Patient Stated Goal: go home once medically ready PT Goal Formulation: With patient Time For Goal Achievement: 11/08/21 Potential to Achieve Goals: Good    Frequency Min 3X/week   Barriers to discharge        Co-evaluation               AM-PAC PT "6 Clicks" Mobility  Outcome Measure Help needed turning  from your back to your side while in a flat bed without using bedrails?: None Help needed moving from lying on your back to sitting on the side of a flat bed without using bedrails?: None Help needed moving to and from a bed to a chair (including a wheelchair)?: None Help needed standing up from a chair using your arms (e.g., wheelchair or bedside chair)?: None Help needed to walk in hospital room?: A Little Help needed climbing 3-5 steps with a railing? : A Little 6 Click Score: 22    End of Session   Activity Tolerance: Patient tolerated treatment well Patient left: in chair;with call bell/phone within reach;Other (comment) (lab staff present and attending) Nurse Communication: Mobility status PT Visit Diagnosis: Muscle weakness (generalized) (M62.81);Unsteadiness on feet (R26.81);History of falling (Z91.81)    Time: 4268-3419 PT Time Calculation (min) (ACUTE ONLY): 17 min   Charges:   PT Evaluation $PT Eval Moderate Complexity: 1 Mod         Angas Isabell U PT, DPT, PN2   Supplemental Physical Therapist [place]    Pager [phone] Acute Rehab Office [phone]

## 2021-10-25 NOTE — ED Notes (Signed)
[facility] notified for pt transfer 

## 2021-10-26 ENCOUNTER — Inpatient Hospital Stay (HOSPITAL_COMMUNITY): Payer: Medicare Other

## 2021-10-26 LAB — BASIC METABOLIC PANEL
Anion gap: 8 (ref 5–15)
BUN: <5 mg/dL — ABNORMAL LOW (ref 8–23)
CO2: 29 mmol/L (ref 22–32)
Calcium: 8.4 mg/dL — ABNORMAL LOW (ref 8.9–10.3)
Chloride: 99 mmol/L (ref 98–111)
Creatinine, Ser: 0.56 mg/dL (ref 0.44–1.00)
GFR, Estimated: >60 mL/min (ref >60)
Glucose, Bld: 125 mg/dL — ABNORMAL HIGH (ref 70–99)
Potassium: 2.6 mmol/L — CL (ref 3.5–5.1)
Sodium: 136 mmol/L (ref 135–145)

## 2021-10-26 LAB — PHOSPHORUS: Phosphorus: 3.2 mg/dL (ref 2.5–4.6)

## 2021-10-26 LAB — CBC
HCT: 28 % — ABNORMAL LOW (ref 36.0–46.0)
Hemoglobin: 9.9 g/dL — ABNORMAL LOW (ref 12.0–15.0)
MCH: 34.7 pg — ABNORMAL HIGH (ref 26.0–34.0)
MCHC: 35.4 g/dL (ref 30.0–36.0)
MCV: 98.2 fL (ref 80.0–100.0)
Platelets: 86 10*3/uL — ABNORMAL LOW (ref 150–400)
RBC: 2.85 MIL/uL — ABNORMAL LOW (ref 3.87–5.11)
RDW: 12.4 % (ref 11.5–15.5)
WBC: 4.6 10*3/uL (ref 4.0–10.5)
nRBC: 0 % (ref 0.0–0.2)

## 2021-10-26 LAB — VITAMIN B12: Vitamin B-12: 187 pg/mL (ref 180–914)

## 2021-10-26 LAB — POTASSIUM: Potassium: 3 mmol/L — ABNORMAL LOW (ref 3.5–5.1)

## 2021-10-26 LAB — TSH: TSH: 8.043 u[IU]/mL — ABNORMAL HIGH (ref 0.350–4.500)

## 2021-10-26 LAB — MRSA NEXT GEN BY PCR, NASAL: MRSA by PCR Next Gen: NOT DETECTED

## 2021-10-26 LAB — MAGNESIUM
Magnesium: 1.2 mg/dL — ABNORMAL LOW (ref 1.7–2.4)
Magnesium: 2.6 mg/dL — ABNORMAL HIGH (ref 1.7–2.4)

## 2021-10-26 LAB — PROCALCITONIN: Procalcitonin: <0.1 ng/mL

## 2021-10-26 MED ORDER — ENSURE ENLIVE PO LIQD
237.0000 mL | Freq: Two times a day (BID) | ORAL | Status: DC
  Administered 2021-10-26 – 2021-10-28 (×3): 237 mL via ORAL

## 2021-10-26 MED ORDER — VITAMIN B-12 1000 MCG PO TABS: 1000.0000 ug | ORAL_TABLET | Freq: Every day | ORAL | Status: DC

## 2021-10-26 MED ORDER — POTASSIUM CHLORIDE 10 MEQ/100ML IV SOLN
10.0000 meq | INTRAVENOUS | Status: AC
  Administered 2021-10-26 (×5): 10 meq via INTRAVENOUS
  Filled 2021-10-26 (×4): qty 100

## 2021-10-26 MED ORDER — CHLORDIAZEPOXIDE HCL 5 MG PO CAPS
10.0000 mg | ORAL_CAPSULE | Freq: Three times a day (TID) | ORAL | Status: DC
  Administered 2021-10-26 – 2021-10-28 (×7): 10 mg via ORAL
  Filled 2021-10-26 (×7): qty 2

## 2021-10-26 MED ORDER — TRAMADOL HCL 50 MG PO TABS: 50.0000 mg | ORAL_TABLET | Freq: Four times a day (QID) | ORAL | Status: DC | PRN

## 2021-10-26 MED ORDER — AZITHROMYCIN 500 MG PO TABS
500.0000 mg | ORAL_TABLET | Freq: Every day | ORAL | Status: DC
  Administered 2021-10-26 – 2021-10-28 (×3): 500 mg via ORAL
  Filled 2021-10-26 (×3): qty 1

## 2021-10-26 MED ORDER — PANTOPRAZOLE SODIUM 40 MG PO TBEC
40.0000 mg | DELAYED_RELEASE_TABLET | Freq: Every day | ORAL | Status: DC
  Administered 2021-10-26 – 2021-10-28 (×3): 40 mg via ORAL
  Filled 2021-10-26 (×3): qty 1

## 2021-10-26 MED ORDER — ADULT MULTIVITAMIN W/MINERALS CH
1.0000 | ORAL_TABLET | Freq: Every day | ORAL | Status: DC
Start: 2021-10-26 — End: 2021-10-28
  Administered 2021-10-26 – 2021-10-28 (×3): 1 via ORAL
  Filled 2021-10-26 (×3): qty 1

## 2021-10-26 MED ORDER — CARVEDILOL 3.125 MG PO TABS
3.1250 mg | ORAL_TABLET | Freq: Two times a day (BID) | ORAL | Status: DC
  Administered 2021-10-26 – 2021-10-28 (×4): 3.125 mg via ORAL
  Filled 2021-10-26 (×4): qty 1

## 2021-10-26 MED ORDER — MAGNESIUM SULFATE 2 GM/50ML IV SOLN
2.0000 g | Freq: Once | INTRAVENOUS | Status: AC
  Administered 2021-10-26: 2 g via INTRAVENOUS
  Filled 2021-10-26: qty 50

## 2021-10-26 MED ORDER — CLONIDINE HCL 0.1 MG PO TABS: 0.1000 mg | ORAL_TABLET | Freq: Four times a day (QID) | ORAL | Status: DC | PRN

## 2021-10-26 MED ORDER — CYANOCOBALAMIN 1000 MCG/ML IJ SOLN
1000.0000 ug | Freq: Every day | INTRAMUSCULAR | Status: DC
  Administered 2021-10-26 – 2021-10-28 (×3): 1000 ug via SUBCUTANEOUS
  Filled 2021-10-26 (×2): qty 1

## 2021-10-26 MED ORDER — POTASSIUM CHLORIDE CRYS ER 20 MEQ PO TBCR
40.0000 meq | EXTENDED_RELEASE_TABLET | Freq: Three times a day (TID) | ORAL | Status: AC
  Administered 2021-10-26 (×3): 40 meq via ORAL
  Filled 2021-10-26 (×3): qty 2

## 2021-10-26 NOTE — Progress Notes (Signed)
Paged on call provider for critical potassium of 2.6. Awaiting response/orders.

## 2021-10-26 NOTE — Progress Notes (Signed)
Speech Language Pathology Treatment: Dysphagia  Patient Details Name: Brittany Randolph MRN: [id number] DOB: July 23, 1946 Today's Date: 10/26/2021 Time: 2505-3976 SLP Time Calculation (min) (ACUTE ONLY): 12 min  Assessment / Plan / Recommendation Clinical Impression  Ms. Brittany Randolph was sitting at the EOB; lunch tray with 100% consumption.  She ate crackers and drank water with thorough mastication, brisk swallow response, and no s/sx of subtle or obvious aspiration.  Adequate coordination of respiratory and swallow cycles.  Dysphagia resolved. No further SLP f/u is needed. No cognitive-linguistic evaluation is warranted.  Our service will sign off.   HPI HPI: Pt is a 75 year old female who presented with concern for fall 2 weeks ago, with continued right-sided chest pain, fatigue and decreased appetite. Pt denied headache, nausea or vomiting prior to admisison, but developed vomiting on arrival. CT chest 12/4: Volume loss of the right lung with cystic fibrotic process and bronchiectasis with relative peripheral sparing in the right lower lobe, coarse chronic interstitial changes and additional volume loss  in the right middle lobe, and scattered ground-glass opacities in the right middle and lower lobes which could be an active pneumonitis component or part of the fibrotic change. PMH: COPD, hypertension      SLP Plan  All goals met      Recommendations for follow up therapy are one component of a multi-disciplinary discharge planning process, led by the attending physician.  Recommendations may be updated based on patient status, additional functional criteria and insurance authorization.    Recommendations  Diet recommendations: Regular;Thin liquid Liquids provided via: Straw;Cup Medication Administration: Whole meds with liquid Supervision: Patient able to self feed                Oral Care Recommendations: Oral care BID Follow Up Recommendations: No SLP follow up Assistance recommended at  discharge: None SLP Visit Diagnosis: Dysphagia, unspecified (R13.10) Plan: All goals met       Arye Weyenberg L. Tivis Ringer, [place] CCC/SLP Acute Rehabilitation Services Office number [phone] Pager [phone]                Juan Quam Laurice  10/26/2021, 2:27 PM

## 2021-10-26 NOTE — Evaluation (Signed)
Occupational Therapy Evaluation Patient Details Name: Brittany Randolph MRN: [id number] DOB: 10/07/1946 Today's Date: 10/26/2021   History of Present Illness 75yo female who presented on 10/24/21 with SOB, also reports a fall at home around thanksgiving. Covid negative. CTH negative for acute changes. PMH EtOH dependence, COPD, HTN, lung lobectomy   Clinical Impression   PTA patient reports independent and driving. Admitted for above and limited by problem list below, including pain, mild unsteadiness and decreased safety awareness.  She currently requires supervision for transfers and in room mobility, ADLs.  Requires cueing for pacing and safety.  VSS during session. Will benefit from further OT acutely to optimize independence and safety, but anticipate no further needs after dc home. Will follow.      Recommendations for follow up therapy are one component of a multi-disciplinary discharge planning process, led by the attending physician.  Recommendations may be updated based on patient status, additional functional criteria and insurance authorization.   Follow Up Recommendations  No OT follow up    Assistance Recommended at Discharge PRN  Functional Status Assessment  Patient has had a recent decline in their functional status and demonstrates the ability to make significant improvements in function in a reasonable and predictable amount of time.  Equipment Recommendations  None recommended by OT    Recommendations for Other Services       Precautions / Restrictions Precautions Precautions: Fall Restrictions Weight Bearing Restrictions: No      Mobility Bed Mobility               General bed mobility comments: EOB upon entry    Transfers Overall transfer level: Needs assistance Equipment used: None Transfers: Sit to/from Stand Sit to Stand: Supervision           General transfer comment: S for safety/line management, no physical assist given      Balance  Overall balance assessment: Needs assistance;History of Falls Sitting-balance support: Feet supported;No upper extremity supported Sitting balance-Leahy Scale: Good     Standing balance support: No upper extremity supported;During functional activity Standing balance-Leahy Scale: Fair                             ADL either performed or assessed with clinical judgement   ADL Overall ADL's : Needs assistance/impaired     Grooming: Supervision/safety;Standing           Upper Body Dressing : Supervision/safety;Set up;Sitting   Lower Body Dressing: Sit to/from stand;Supervision/safety   Toilet Transfer: Ambulation;Supervision/safety   Toileting- Clothing Manipulation and Hygiene: Sit to/from stand;Supervision/safety       Functional mobility during ADLs: Min guard;Supervision/safety;Cueing for safety       Vision Baseline Vision/History: 1 Wears glasses Ability to See in Adequate Light: 0 Adequate Patient Visual Report: No change from baseline Vision Assessment?: No apparent visual deficits     Perception     Praxis      Pertinent Vitals/Pain Pain Assessment: Faces Faces Pain Scale: Hurts little more Pain Location: R side of trunk with deep breaths Pain Descriptors / Indicators: Discomfort Pain Intervention(s): Limited activity within patient's tolerance;Monitored during session;Repositioned     Hand Dominance Right   Extremity/Trunk Assessment Upper Extremity Assessment Upper Extremity Assessment: Generalized weakness   Lower Extremity Assessment Lower Extremity Assessment: Defer to PT evaluation   Cervical / Trunk Assessment Cervical / Trunk Assessment: Kyphotic   Communication Communication Communication: HOH (has hearing aides)   Cognition Arousal/Alertness: Awake/alert  Behavior During Therapy: WFL for tasks assessed/performed Overall Cognitive Status: Within Functional Limits for tasks assessed                                  General Comments: decreased STM but pt reports this is baseline.     General Comments  VSS on RA    Exercises     Shoulder Instructions      Home Living Family/patient expects to be discharged to:: Private residence Living Arrangements: Alone Available Help at Discharge: Family;Available PRN/intermittently;Friend(s)   Home Access: Stairs to enter Entrance Stairs-Number of Steps: 2 STE no rails in the back;  front has about 7 steps with rails but she rarely uses front entrance   Home Layout: One level     Bathroom Shower/Tub: Chief Strategy Officer: Standard     Home Equipment: None   Additional Comments: had a recent fall- stumbled on slippers in the middle of the night coming back from the bathroom      Prior Functioning/Environment Prior Level of Function : Independent/Modified Independent;Driving               ADLs Comments: drives, goes to silver sneakers classes, manages meds        OT Problem List: Decreased strength;Decreased activity tolerance;Impaired balance (sitting and/or standing);Pain;Decreased safety awareness      OT Treatment/Interventions: Self-care/ADL training;Therapeutic exercise;DME and/or AE instruction;Therapeutic activities;Balance training;Patient/family education    OT Goals(Current goals can be found in the care plan section) Acute Rehab OT Goals Patient Stated Goal: home OT Goal Formulation: With patient Time For Goal Achievement: 11/09/21 Potential to Achieve Goals: Good  OT Frequency: Min 2X/week   Barriers to D/C:            Co-evaluation              AM-PAC OT "6 Clicks" Daily Activity     Outcome Measure Help from another person eating meals?: None Help from another person taking care of personal grooming?: A Little Help from another person toileting, which includes using toliet, bedpan, or urinal?: A Little Help from another person bathing (including washing, rinsing, drying)?: A  Little Help from another person to put on and taking off regular upper body clothing?: A Little Help from another person to put on and taking off regular lower body clothing?: A Little 6 Click Score: 19   End of Session Nurse Communication: Mobility status  Activity Tolerance: Patient tolerated treatment well Patient left: in chair;with call bell/phone within reach;with nursing/sitter in room  OT Visit Diagnosis: Unsteadiness on feet (R26.81);History of falling (Z91.81)                Time: 0942-1000 OT Time Calculation (min): 18 min Charges:  OT General Charges $OT Visit: 1 Visit OT Evaluation $OT Eval Moderate Complexity: 1 Mod  Barry Brunner, OT Acute Rehabilitation Services Pager [phone] Office [phone]   Chancy Milroy 10/26/2021, 10:16 AM

## 2021-10-26 NOTE — Progress Notes (Signed)
Initial Nutrition Assessment  DOCUMENTATION CODES:   Non-severe (moderate) malnutrition in context of social or environmental circumstances  INTERVENTION:   Multivitamin w/ minerals daily Encourage good PO intake Ensure Enlive po BID, each supplement provides 350 kcal and 20 grams of protein  NUTRITION DIAGNOSIS:   Moderate Malnutrition related to social / environmental circumstances as evidenced by moderate muscle depletion, mild fat depletion.  GOAL:   Patient will meet greater than or equal to 90% of their needs  MONITOR:   PO intake, Supplement acceptance, I & O's, Labs  REASON FOR ASSESSMENT:   Consult Other (Comment) (Nutritional Goals)  ASSESSMENT:   75 y.o. female presented to [facility] with SOB and decreased intake. PMH includes HTN, COPD, and EtOH abuse. Pt admitted with dyspnea, failure to thrive, and EtOH dependence.   Pt reports that her appetite has been poor for the past few weeks, states she just did not feel hungry or like eating. States that she cooked thanksgiving dinner but did not eat anything. Pt reports that her appetite has improved since being admitted, but it is not back to Pt reports that she lives alone and does all of her cooking, but sometimes does not like to cook due to it only being her and that she has to eat the leftovers for days later. Pt reports a typical intake of: Breakfast: nothing Lunch: Sandwich, sometimes with chips  Dinner: Sandwich   Pt reports that sometimes she will drink Ensure and if she does, she drinks that for lunch.  Pt reports that she does drink alcohol every day, but it depends on what she is doing that night on how much she drinks. Pt states that she typically drinks wine or sometimes has a bloody mary. Pt reports that she is very active and participates in a lot of activities to stay active.   Pt reports a UBW of 139#, believes that she has lost some weight due to being sick and not eating but does not know how much weight  she has lost. No weight history per EMR. Pt reports no assistance with ambulating.   Discussed ONS with pt; pt agreeable to Ensure since she already drinks them at home.   Medications reviewed and include: Colace, Potassium Chloride, Thiamine, IV antibiotics, IV Potassium Chloride, IV Magnesium Sulfate Labs reviewed: Potassium 2.6, BUN <5, Magnesium 1.2  NUTRITION - FOCUSED PHYSICAL EXAM:  Flowsheet Row Most Recent Value  Orbital Region Mild depletion  Upper Arm Region No depletion  Thoracic and Lumbar Region No depletion  Buccal Region Mild depletion  Temple Region Mild depletion  Clavicle Bone Region Mild depletion  Clavicle and Acromion Bone Region Moderate depletion  Scapular Bone Region Moderate depletion  Dorsal Hand Mild depletion  Patellar Region Mild depletion  Anterior Thigh Region Mild depletion  Posterior Calf Region Mild depletion  Edema (RD Assessment) None  Hair Reviewed  Eyes Reviewed  Mouth Reviewed  Skin Reviewed  Nails Reviewed       Diet Order:   Diet Order             Diet regular Room service appropriate? Yes; Fluid consistency: Thin  Diet effective now                   EDUCATION NEEDS:   No education needs have been identified at this time  Skin:  Skin Assessment: Reviewed RN Assessment  Last BM:  10/25/2021  Height:   Ht Readings from Last 1 Encounters:  10/25/21 5\' 4"  (1.626  m)    Weight:   Wt Readings from Last 1 Encounters:  10/25/21 61.2 kg    Ideal Body Weight:  54.6 kg  BMI:  Body mass index is 23.17 kg/m.  Estimated Nutritional Needs:   Kcal:  1850-2050  Protein:  95-110 grams  Fluid:  > 1.8 L    Jonathan Kirkendoll Dannial Monarch, RD, LDN Clinical Dietitian See [facility] for contact information.

## 2021-10-26 NOTE — Progress Notes (Signed)
PROGRESS NOTE                                                                                                                                                                                                             Patient Demographics:    Brittany Randolph, is a 75 y.o. female, DOB - [birth year], ZOX:[id number]  Outpatient Primary MD for the patient is Pcp, No    LOS - 1  Admit date - 10/24/2021    Chief Complaint  Patient presents with   Chest Pain   Fall   Shortness of Breath       Brief Narrative (HPI from H&P) - Brittany Randolph is a 75 y.o. female with medical history significant of COPD; HTN; and ETOH dependence presenting to [facility] with SOB. She reports that around Thanksgiving, she tripped over a slipper in the floor and hit her R eye on the corner of the end table.  She also hit her R chest on a stool, she was found to have a large R. Eye hematoma, apparently family was visiting her and found to be intoxicated and sleepy, they also noticed a large hematoma around the right eye, she told the family that she was recovering from a chest infection which she had a week ago, they did not found her to be short of breath but did notice a cough.  She was brought to the hospital where she was found to have a large right hematoma, severe dehydration with severe hypokalemia and hypomagnesemia and she was admitted.    Subjective:    [name] today has, No headache, No chest pain, No abdominal pain - No Nausea, No new weakness tingling or numbness, no cough or SOB.   Assessment  & Plan :      Acute Alcohol intoxication with poor oral intake causing severe dehydration, hypokalemia and hypomagnesemia- she has been extensively counseled to quit drinking alcohol, placed her on Librium along with CIWA protocol high risk for going into DTs.  IV fluids along with electrolyte replacement.  Monitor electrolytes closely.  PT OT  2.   Nonspecific CT chest findings.  Likely chronic infection.  Oral azithromycin, no signs of acute infection.  Outpatient pulmonary follow-up within a month of discharge for repeat CT.  3.  Hypertension.  We will place her on Coreg along with  as needed Catapres combination.  4.  Thrombocytopenia.  Likely undiagnosed alcoholic cirrhosis.  Monitor.  5.  B12 deficiency.  Replace.  6.  Tortuous thoracic aorta.  Placed on beta-blocker outpatient monitoring by PCP.       Condition - Fair  Family Communication  :  Son Bethann Berkshire [phone] - 10/26/21  Code Status :  Full  Consults  :  None  PUD Prophylaxis : PPI   Procedures  :     CT Head - Non Acute  CT Chest - 1. Prominent pulmonary trunk. No central embolus is seen but contrast opacification very limited distal to the hila due to bolus timing. No evidence for acute right heart strain. 2. Moderately tortuous aorta but no aneurysm or dissection. Aortic atherosclerosis. 3. 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 4. Volume loss of the right lung with cystic fibrotic process and bronchiectasis with relative peripheral sparing in the right lower lobe, coarse chronic interstitial changes and additional volume loss in the right middle lobe, and scattered ground-glass opacities in the right middle and lower lobes which could be an active pneumonitis component or part of the fibrotic change. This is probably due to an old granulomatous infection given the numerous calcified right hilar nodes, and / or a chronic inflammatory process. Interstitial micronodularity in the right upper lobe also is likely chronic, but age indeterminate without prior studies. A three-month follow-up chest CT is recommended to ensure stability. 5. Hyperinflated but otherwise clear left lung. 6. Moderate liver steatosis. 7. Distended gallbladder, incompletely visualized but no wall thickening where visible.      Disposition Plan  :    Status is: Inpatient  Remains inpatient  appropriate because: Severe dehydration with severe hypokalemia and hypomagnesemia  DVT Prophylaxis  :    enoxaparin (LOVENOX) injection 40 mg Start: 10/25/21 1800    Lab Results  Component Value Date   PLT 86 (L) 10/26/2021    Diet :  Diet Order             Diet regular Room service appropriate? Yes; Fluid consistency: Thin  Diet effective now                    Inpatient Medications  Scheduled Meds:  amLODipine  5 mg Oral Daily   aspirin EC  81 mg Oral Daily   azithromycin  500 mg Oral Daily   chlordiazePOXIDE  10 mg Oral TID   citalopram  20 mg Oral Daily   cyanocobalamin  1,000 mcg Subcutaneous Daily   docusate sodium  100 mg Oral BID   enoxaparin (LOVENOX) injection  40 mg Subcutaneous Q24H   gabapentin  400 mg Oral TID   LORazepam  0-4 mg Intravenous Q6H   Or   LORazepam  0-4 mg Oral Q6H   [START ON 10/27/2021] LORazepam  0-4 mg Intravenous Q12H   Or   [START ON 10/27/2021] LORazepam  0-4 mg Oral Q12H   pantoprazole  40 mg Oral Daily   potassium chloride  40 mEq Oral TID   pravastatin  20 mg Oral Daily   QUEtiapine  100 mg Oral Daily   sodium chloride flush  3 mL Intravenous Q12H   thiamine  100 mg Oral Daily   Or   thiamine  100 mg Intravenous Daily   [START ON 11/02/2021] vitamin B-12  1,000 mcg Oral Daily   Continuous Infusions:  sodium chloride 10 mL/hr at 10/25/21 0055   lactated ringers 75 mL/hr at 10/26/21 0305  magnesium sulfate bolus IVPB     PRN Meds:.sodium chloride, acetaminophen **OR** acetaminophen, albuterol, bisacodyl, hydrALAZINE, [DISCONTINUED] ondansetron **OR** ondansetron (ZOFRAN) IV, polyethylene glycol, traMADol, traZODone  Antibiotics  :    Anti-infectives (From admission, onward)    Start     Dose/Rate Route Frequency Ordered Stop   10/26/21 1230  azithromycin (ZITHROMAX) tablet 500 mg        500 mg Oral Daily 10/26/21 1139     10/25/21 2000  cefTRIAXone (ROCEPHIN) 2 g in sodium chloride 0.9 % 100 mL IVPB  Status:   Discontinued        2 g 200 mL/hr over 30 Minutes Intravenous Every 24 hours 10/25/21 1240 10/26/21 1135   10/25/21 2000  azithromycin (ZITHROMAX) 500 mg in sodium chloride 0.9 % 250 mL IVPB        500 mg 250 mL/hr over 60 Minutes Intravenous  Once 10/25/21 1240 10/25/21 2125   10/24/21 2100  azithromycin (ZITHROMAX) 500 mg in sodium chloride 0.9 % 250 mL IVPB        500 mg 250 mL/hr over 60 Minutes Intravenous  Once 10/24/21 2053 10/24/21 2320   10/24/21 2100  cefTRIAXone (ROCEPHIN) 1 g in sodium chloride 0.9 % 100 mL IVPB        1 g 200 mL/hr over 30 Minutes Intravenous  Once 10/24/21 2053 10/24/21 2147        Time Spent in minutes  30   Susa Raring M.D on 10/26/2021 at 11:39 AM  To page go to [email or web address]   [facility] -  Office  [phone]  See all Orders from today for further details    Objective:   Vitals:   10/25/21 1613 10/25/21 2322 10/26/21 0000 10/26/21 0329  BP: (!) 154/81 (!) 147/82 123/68 134/66  Pulse: 77  73 68  Resp: 16 18 (!) 21 (!) 25  Temp: 97.9 F (36.6 C) 98.4 F (36.9 C)  98.7 F (37.1 C)  TempSrc: Oral Oral  Oral  SpO2: 94%  93% 91%  Weight:      Height:        Wt Readings from Last 3 Encounters:  10/25/21 61.2 kg     Intake/Output Summary (Last 24 hours) at 10/26/2021 1139 Last data filed at 10/26/2021 0636 Gross per 24 hour  Intake 2720.93 ml  Output --  Net 2720.93 ml     Physical Exam  Awake Alert, No new F.N deficits,  [place].AT,R-periorbital bruise Supple Neck, No JVD,   Symmetrical Chest wall movement, Good air movement bilaterally, CTAB RRR,No Gallops,Rubs or new Murmurs,  +ve B.Sounds, Abd Soft, No tenderness,   No Cyanosis, Clubbing or edema        Data Review:    CBC Recent Labs  Lab 10/24/21 2019 10/24/21 2251 10/26/21 0108  WBC 7.1  --  4.6  HGB 13.2 12.2 9.9*  HCT 37.3 36.0 28.0*  PLT 132*  --  86*  MCV 96.1  --  98.2  MCH 34.0  --  34.7*  MCHC 35.4  --  35.4  RDW 12.6  --  12.4   LYMPHSABS 1.1  --   --   MONOABS 0.3  --   --   EOSABS 0.0  --   --   BASOSABS 0.0  --   --     Electrolytes Recent Labs  Lab 10/24/21 2019 10/24/21 2245 10/24/21 2251 10/25/21 1431 10/25/21 1703 10/26/21 0108 10/26/21 0609  NA 139 137 137  --   --  136  --   K 3.1* 3.1* 3.1*  --   --  2.6*  --   CL 99 101  --   --   --  99  --   CO2 17* 19*  --   --   --  29  --   GLUCOSE 109* 131*  --   --   --  125*  --   BUN 11 10  --   --   --  <5*  --   CREATININE 0.51 0.50  --   --   --  0.56  --   CALCIUM 9.0 8.8*  --   --   --  8.4*  --   AST 125*  --   --   --   --   --   --   ALT 36  --   --   --   --   --   --   ALKPHOS 223*  --   --   --   --   --   --   BILITOT 2.6*  --   --   --   --   --   --   ALBUMIN 3.9  --   --   --   --   --   --   MG  --   --   --   --   --  1.2*  --   PROCALCITON  --   --   --   --   --  <0.10  --   LATICACIDVEN 6.1* 3.8*  --  1.4 1.0  --   --   INR  --  1.1  --   --   --   --   --   TSH  --   --   --   --   --   --  8.043*    ------------------------------------------------------------------------------------------------------------------ No results for input(s): CHOL, HDL, LDLCALC, TRIG, CHOLHDL, LDLDIRECT in the last 72 hours.  No results found for: HGBA1C  Recent Labs    10/26/21 0609  TSH 8.043*   ------------------------------------------------------------------------------------------------------------------ ID Labs Recent Labs  Lab 10/24/21 2019 10/24/21 2245 10/25/21 1431 10/25/21 1703 10/26/21 0108  WBC 7.1  --   --   --  4.6  PLT 132*  --   --   --  86*  PROCALCITON  --   --   --   --  <0.10  LATICACIDVEN 6.1* 3.8* 1.4 1.0  --   CREATININE 0.51 0.50  --   --  0.56   Cardiac Enzymes No results for input(s): CKMB, TROPONINI, MYOGLOBIN in the last 168 hours.  Invalid input(s): CK   Radiology Reports DG Chest 2 View  Result Date: 10/24/2021 CLINICAL DATA:  Dyspnea, chest pain EXAM: CHEST - 2 VIEW COMPARISON:  None.  FINDINGS: There is focal infiltrate within the right middle lobe, possibly infectious in the appropriate clinical setting. Minimal infiltrate may be present within the lingula. No pneumothorax or pleural effusion. Cardiac size within normal limits. 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 Pulmonary vascularity is normal. No acute bone abnormality. IMPRESSION: Right middle lobe pneumonic infiltrate. Follow-up radiograph is recommended in 3-4 weeks to document complete resolution and exclude the presence of a central obstructing lesion. Aneurysmal dilation of the thoracic aorta. This would better assessed with contrast enhanced CT examination if prior examinations are unavailable for comparison, if clinically indicated. Electronically Signed   By: Gloris Ham  Ramiro Harvest M.D.   On: 10/24/2021 19:39   CT Head Wo Contrast  Result Date: 10/24/2021 CLINICAL DATA:  Fall injury. EXAM: CT HEAD WITHOUT CONTRAST TECHNIQUE: Contiguous axial images were obtained from the base of the skull through the vertex without intravenous contrast. COMPARISON:  None. FINDINGS: Brain: There is mild cerebral atrophy and atrophic ventriculomegaly with mild-to-moderate small vessel disease of the cerebral white matter and a few small chronic bilateral gangliocapsular lacunar infarcts. There is slight cerebellar atrophy. No asymmetry is seen concerning for acute infarct, hemorrhage or mass. There is no midline shift. Vascular: There are scattered calcifications in the carotid siphons but no hyperdense central vasculature. Additional calcification distal left vertebral artery. Skull: Normal aside from osteopenia. Negative for fracture or focal lesion. No scalp hematoma is visible. Sinuses/Orbits: No acute finding. Evidence of prior lens extractions. Right-sided deviation and spurring of the nasal septum. Other: None. IMPRESSION: No acute intracranial CT findings or depressed skull fractures. Chronic change.  Electronically Signed   By: Almira Bar M.D.   On: 10/24/2021 20:34   CT Chest W Contrast  Result Date: 10/25/2021 CLINICAL DATA:  Dyspnea and chest pain, suspected pneumonia on chest x-ray, with prominent aortic configuration on chest x-ray. Also recent fall injury in last 2 weeks. EXAM: CT CHEST WITH CONTRAST TECHNIQUE: Multidetector CT imaging of the chest was performed during intravenous contrast administration. CONTRAST:  OMNIPAQUE IOHEXOL 300 MG/ML  SOLN COMPARISON:  PA and lateral chest yesterday. FINDINGS: Cardiovascular: The heart is upper limits of normal in size. There are minimal to mild scattered three-vessel coronary artery calcifications with no pericardial effusion no venous distention. There is a prominent pulmonary trunk measuring 3.6 cm indicating arterial hypertension but no findings of acute right heart strain. No central embolus is seen but arterial opacification distal to the hila is very limited due to bolus timing. The aorta is nonaneurysmal but is moderately tortuous. There are mild scattered calcific plaques. The aortic root is 3.1 cm, ascending aorta is 3.2 cm, the arch measures up to 3.1 cm and the descending segment 2.9 cm. There is no dissection no great vessel stenosis. Mediastinum/Nodes: There are multiple calcified lymph nodes in the right-sided subcarinal space and right hilum. A single mildly prominent right paratracheal space lymph node measures 1.6 x 1.1 cm and there is no further noncalcified intrathoracic adenopathy. No axillary or thyroid mass is seen. Unremarkable thoracic esophagus. Lungs/Pleura: Right chest volume loss and mild right mediastinal shift is present with cystic fibrotic changes with relative peripheral sparing in the right lower lobe, with bronchiectasis, with individual cystic spaces separated by slightly thickened septa and the cysts ranging from few mm up to 1.2 cm in size. In the right middle lobe there coarse chronic interstitial changes and  additional volume loss, without the cystic component seen in the lower lobe. There is scattered ground-glass opacity in the right middle and lower lobes which is probably part of the chronic scarring process with resolving pneumonitis also possible. In the right upper lobe apex there are multiple scattered interstitial micronodules primarily in the periphery. There is reticulated scarring at the extreme apex. Left lung is hyperinflated and clear. The central airways are patent. No pleural effusion is seen. Upper Abdomen: Liver is moderately steatotic. The gallbladder is distended without appreciable focal abnormality of the visualized portion. Musculoskeletal: There are degenerative changes and mild kyphosis of the thoracic spine. IMPRESSION: 1. Prominent pulmonary trunk. No central embolus is seen but contrast opacification very limited distal to the hila due  to bolus timing. No evidence for acute right heart strain. 2. Moderately tortuous aorta but no aneurysm or dissection. Aortic atherosclerosis. 3. 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 4. Volume loss of the right lung with cystic fibrotic process and bronchiectasis with relative peripheral sparing in the right lower lobe, coarse chronic interstitial changes and additional volume loss in the right middle lobe, and scattered ground-glass opacities in the right middle and lower lobes which could be an active pneumonitis component or part of the fibrotic change. This is probably due to an old granulomatous infection given the numerous calcified right hilar nodes, and / or a chronic inflammatory process. Interstitial micronodularity in the right upper lobe also is likely chronic, but age indeterminate without prior studies. A three-month follow-up chest CT is recommended to ensure stability. 5. Hyperinflated but otherwise clear left lung. 6. Moderate liver steatosis. 7. Distended gallbladder, incompletely visualized but no wall thickening where visible.  Electronically Signed   By: Almira Bar M.D.   On: 10/25/2021 01:42   DG Chest Port 1 View  Result Date: 10/26/2021 CLINICAL DATA:  Encounter for shortness of breath EXAM: PORTABLE CHEST 1 VIEW COMPARISON:  Radiography from 2 days ago FINDINGS: Volume loss and pulmonary parenchymal opacity at the right base where there is fibrosis by recent CT. The left lung is clear. Stable heart size and aortic tortuosity. IMPRESSION: Scarring/fibrosis at the right base. No acute finding when compared to prior. Electronically Signed   By: Tiburcio Pea M.D.   On: 10/26/2021 06:43

## 2021-10-26 NOTE — TOC Initial Note (Signed)
Transition of Care [facility]) - Initial/Assessment Note    Patient Details  Name: Brittany Randolph MRN: [id number] Date of Birth: [birth year]  Transition of Care [facility]) CM/SW Contact:    Harriet Masson, RN Phone Number: 10/26/2021, 3:14 PM  Clinical Narrative:        Spoke to patient regarding outpatient PT. Patient's choice is [facility] in [place]. Referral order faxed. Patient states she has transportation once discharged and family can take her to Physical therapy.  Expected Discharge Plan: Home/Self Care (out patient PT) Barriers to Discharge: Continued Medical Work up   Patient Goals and CMS Choice Patient states their goals for this hospitalization and ongoing recovery are:: return home CMS Medicare.gov Compare Post Acute Care list provided to:: Patient Choice offered to / list presented to : Patient  Expected Discharge Plan and Services Expected Discharge Plan: Home/Self Care (out patient PT)                                              Prior Living Arrangements/Services     Patient language and need for interpreter reviewed:: Yes Do you feel safe going back to the place where you live?: Yes      Need for Family Participation in Patient Care: Yes (Comment) Care giver support system in place?: Yes (comment)   Criminal Activity/Legal Involvement Pertinent to Current Situation/Hospitalization: No - Comment as needed  Activities of Daily Living Home Assistive Devices/Equipment: None ADL Screening (condition at time of admission) Patient's cognitive ability adequate to safely complete daily activities?: Yes Is the patient deaf or have difficulty hearing?: No Does the patient have difficulty seeing, even when wearing glasses/contacts?: No Does the patient have difficulty concentrating, remembering, or making decisions?: No Patient able to express need for assistance with ADLs?: Yes Does the patient have difficulty dressing or bathing?: No Independently  performs ADLs?: Yes (appropriate for developmental age) Does the patient have difficulty walking or climbing stairs?: No Weakness of Legs: None Weakness of Arms/Hands: None  Permission Sought/Granted Permission sought to share information with : Facility Engineer, maintenance (IT) granted to share info w AGENCY: out patient rehab        Emotional Assessment Appearance:: Appears stated age Attitude/Demeanor/Rapport: Engaged Affect (typically observed): Accepting Orientation: : Oriented to Self, Oriented to Place, Oriented to  Time, Oriented to Situation   Psych Involvement: No (comment)  Admission diagnosis:  Dehydration [E86.0] Lactic acidosis [E87.20] Starvation ketoacidosis [T73.0XXA, E87.29] Alcohol abuse [F10.10] Transaminitis [R74.01] CAP (community acquired pneumonia) [J18.9] Elevated bilirubin [R17] Fall, initial encounter [W19.XXXA] Pneumonia of right middle lobe due to infectious organism [J18.9] RML pneumonia [J18.9] Patient Active Problem List   Diagnosis Date Noted   RML pneumonia 10/25/2021   Dyspnea 10/25/2021   Essential hypertension 10/25/2021   Alcohol dependence (HCC) 10/25/2021   Failure to thrive in adult 10/25/2021   CAP (community acquired pneumonia) 10/24/2021   PCP:  Doreen Salvage, PA-C Pharmacy:   [facility] - [place], [place] - [address] [place] [address] Phone: [phone] Fax: [phone]     Social Determinants of Health (SDOH) Interventions    Readmission Risk Interventions No flowsheet data found.

## 2021-10-27 DIAGNOSIS — F1010 Alcohol abuse, uncomplicated: Secondary | ICD-10-CM

## 2021-10-27 LAB — CBC WITH DIFFERENTIAL/PLATELET
Abs Immature Granulocytes: 0.07 10*3/uL (ref 0.00–0.07)
Basophils Absolute: 0 10*3/uL (ref 0.0–0.1)
Basophils Relative: 1 %
Eosinophils Absolute: 0.1 10*3/uL (ref 0.0–0.5)
Eosinophils Relative: 3 %
HCT: 28.7 % — ABNORMAL LOW (ref 36.0–46.0)
Hemoglobin: 9.7 g/dL — ABNORMAL LOW (ref 12.0–15.0)
Immature Granulocytes: 1 %
Lymphocytes Relative: 25 %
Lymphs Abs: 1.4 10*3/uL (ref 0.7–4.0)
MCH: 34.3 pg — ABNORMAL HIGH (ref 26.0–34.0)
MCHC: 33.8 g/dL (ref 30.0–36.0)
MCV: 101.4 fL — ABNORMAL HIGH (ref 80.0–100.0)
Monocytes Absolute: 0.4 10*3/uL (ref 0.1–1.0)
Monocytes Relative: 7 %
Neutro Abs: 3.4 10*3/uL (ref 1.7–7.7)
Neutrophils Relative %: 63 %
Platelets: 74 10*3/uL — ABNORMAL LOW (ref 150–400)
RBC: 2.83 MIL/uL — ABNORMAL LOW (ref 3.87–5.11)
RDW: 12.8 % (ref 11.5–15.5)
WBC: 5.4 10*3/uL (ref 4.0–10.5)
nRBC: 0.4 % — ABNORMAL HIGH (ref 0.0–0.2)

## 2021-10-27 LAB — COMPREHENSIVE METABOLIC PANEL
ALT: 19 U/L (ref 0–44)
AST: 45 U/L — ABNORMAL HIGH (ref 15–41)
Albumin: 2.9 g/dL — ABNORMAL LOW (ref 3.5–5.0)
Alkaline Phosphatase: 140 U/L — ABNORMAL HIGH (ref 38–126)
Anion gap: 8 (ref 5–15)
BUN: <5 mg/dL — ABNORMAL LOW (ref 8–23)
CO2: 26 mmol/L (ref 22–32)
Calcium: 8.5 mg/dL — ABNORMAL LOW (ref 8.9–10.3)
Chloride: 103 mmol/L (ref 98–111)
Creatinine, Ser: 0.54 mg/dL (ref 0.44–1.00)
GFR, Estimated: >60 mL/min (ref >60)
Glucose, Bld: 121 mg/dL — ABNORMAL HIGH (ref 70–99)
Potassium: 3.1 mmol/L — ABNORMAL LOW (ref 3.5–5.1)
Sodium: 137 mmol/L (ref 135–145)
Total Bilirubin: 1.8 mg/dL — ABNORMAL HIGH (ref 0.3–1.2)
Total Protein: 5.5 g/dL — ABNORMAL LOW (ref 6.5–8.1)

## 2021-10-27 LAB — PROCALCITONIN: Procalcitonin: <0.1 ng/mL

## 2021-10-27 LAB — CULTURE, BLOOD (ROUTINE X 2): Special Requests: ADEQUATE

## 2021-10-27 LAB — PHOSPHORUS: Phosphorus: 2.4 mg/dL — ABNORMAL LOW (ref 2.5–4.6)

## 2021-10-27 LAB — MAGNESIUM: Magnesium: 1.7 mg/dL (ref 1.7–2.4)

## 2021-10-27 LAB — BRAIN NATRIURETIC PEPTIDE: B Natriuretic Peptide: 154.3 pg/mL — ABNORMAL HIGH (ref 0.0–100.0)

## 2021-10-27 MED ORDER — LACTATED RINGERS IV SOLN: INTRAVENOUS | Status: AC

## 2021-10-27 MED ORDER — POTASSIUM CHLORIDE CRYS ER 20 MEQ PO TBCR
40.0000 meq | EXTENDED_RELEASE_TABLET | Freq: Two times a day (BID) | ORAL | Status: AC
  Administered 2021-10-27 (×2): 40 meq via ORAL
  Filled 2021-10-27 (×2): qty 2

## 2021-10-27 MED ORDER — MAGNESIUM SULFATE IN D5W 1-5 GM/100ML-% IV SOLN
1.0000 g | Freq: Once | INTRAVENOUS | Status: AC
  Administered 2021-10-27: 1 g via INTRAVENOUS
  Filled 2021-10-27: qty 100

## 2021-10-27 MED ORDER — POTASSIUM PHOSPHATES 15 MMOLE/5ML IV SOLN
30.0000 mmol | Freq: Once | INTRAVENOUS | Status: AC
  Administered 2021-10-27: 30 mmol via INTRAVENOUS
  Filled 2021-10-27: qty 10

## 2021-10-27 NOTE — Progress Notes (Signed)
PROGRESS NOTE                                                                                                                                                                                                             Patient Demographics:    Brittany Randolph, is a 75 y.o. female, DOB - [birth year], ZOX:[id number]  Outpatient Primary MD for the patient is Doreen Salvage, PA-C    LOS - 2  Admit date - 10/24/2021    Chief Complaint  Patient presents with   Chest Pain   Fall   Shortness of Breath       Brief Narrative (HPI from H&P) - Brittany Randolph is a 75 y.o. female with medical history significant of COPD; HTN; and ETOH dependence presenting to [facility] with SOB. She reports that around Thanksgiving, she tripped over a slipper in the floor and hit her R eye on the corner of the end table.  She also hit her R chest on a stool, she was found to have a large R. Eye hematoma, apparently family was visiting her and found to be intoxicated and sleepy, they also noticed a large hematoma around the right eye, she told the family that she was recovering from a chest infection which she had a week ago, they did not found her to be short of breath but did notice a cough.  She was brought to the hospital where she was found to have a large right hematoma, severe dehydration with severe hypokalemia and hypomagnesemia and she was admitted.    Subjective:   Patient in bed, appears comfortable, denies any headache, no fever, no chest pain or pressure, no shortness of breath , no abdominal pain. No new focal weakness.    Assessment  & Plan :      Acute Alcohol intoxication with poor oral intake causing severe dehydration, hypokalemia and hypomagnesemia- she has been extensively counseled to quit drinking alcohol, placed her on Librium along with CIWA protocol high risk for going into DTs.  IV fluids along with continued electrolyte replacement.   Monitor electrolytes closely.  PT OT  2.  Nonspecific CT chest findings.  Likely chronic infection.  Oral azithromycin, no signs of acute infection.  Outpatient pulmonary follow-up within a month of discharge for repeat CT.  3.  Hypertension. Placed on Coreg & continue  with as needed Catapres combination.  4.  Thrombocytopenia.  Likely undiagnosed alcoholic cirrhosis.  Monitor.  5.  B12 deficiency.  Replace.  6.  Tortuous thoracic aorta.  Placed on beta-blocker outpatient monitoring by PCP.       Condition - Fair  Family Communication  :  Son Bethann Berkshire [phone] - 10/26/21  Code Status :  Full  Consults  :  None  PUD Prophylaxis : PPI   Procedures  :     CT Head - Non Acute  CT Chest - 1. Prominent pulmonary trunk. No central embolus is seen but contrast opacification very limited distal to the hila due to bolus timing. No evidence for acute right heart strain. 2. Moderately tortuous aorta but no aneurysm or dissection. Aortic atherosclerosis. 3. 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 4. Volume loss of the right lung with cystic fibrotic process and bronchiectasis with relative peripheral sparing in the right lower lobe, coarse chronic interstitial changes and additional volume loss in the right middle lobe, and scattered ground-glass opacities in the right middle and lower lobes which could be an active pneumonitis component or part of the fibrotic change. This is probably due to an old granulomatous infection given the numerous calcified right hilar nodes, and / or a chronic inflammatory process. Interstitial micronodularity in the right upper lobe also is likely chronic, but age indeterminate without prior studies. A three-month follow-up chest CT is recommended to ensure stability. 5. Hyperinflated but otherwise clear left lung. 6. Moderate liver steatosis. 7. Distended gallbladder, incompletely visualized but no wall thickening where visible.      Disposition Plan  :    Status  is: Inpatient  Remains inpatient appropriate because: Severe dehydration with severe hypokalemia and hypomagnesemia  DVT Prophylaxis  :    enoxaparin (LOVENOX) injection 40 mg Start: 10/25/21 1800    Lab Results  Component Value Date   PLT 74 (L) 10/27/2021    Diet :  Diet Order             Diet regular Room service appropriate? Yes with Assist; Fluid consistency: Thin  Diet effective now                    Inpatient Medications  Scheduled Meds:  aspirin EC  81 mg Oral Daily   azithromycin  500 mg Oral Daily   carvedilol  3.125 mg Oral BID WC   chlordiazePOXIDE  10 mg Oral TID   citalopram  20 mg Oral Daily   cyanocobalamin  1,000 mcg Subcutaneous Daily   docusate sodium  100 mg Oral BID   enoxaparin (LOVENOX) injection  40 mg Subcutaneous Q24H   feeding supplement  237 mL Oral BID BM   gabapentin  400 mg Oral TID   LORazepam  0-4 mg Intravenous Q12H   Or   LORazepam  0-4 mg Oral Q12H   multivitamin with minerals  1 tablet Oral Daily   pantoprazole  40 mg Oral Daily   potassium chloride  40 mEq Oral BID   pravastatin  20 mg Oral Daily   QUEtiapine  100 mg Oral Daily   sodium chloride flush  3 mL Intravenous Q12H   thiamine  100 mg Oral Daily   Or   thiamine  100 mg Intravenous Daily   [START ON 11/02/2021] vitamin B-12  1,000 mcg Oral Daily   Continuous Infusions:  sodium chloride 10 mL/hr at 10/25/21 0055   lactated ringers     potassium PHOSPHATE IVPB (  in mmol) 30 mmol (10/27/21 1051)   PRN Meds:.sodium chloride, acetaminophen **OR** acetaminophen, albuterol, bisacodyl, cloNIDine, hydrALAZINE, [DISCONTINUED] ondansetron **OR** ondansetron (ZOFRAN) IV, polyethylene glycol, traMADol, traZODone  Antibiotics  :    Anti-infectives (From admission, onward)    Start     Dose/Rate Route Frequency Ordered Stop   10/26/21 1230  azithromycin (ZITHROMAX) tablet 500 mg        500 mg Oral Daily 10/26/21 1139     10/25/21 2000  cefTRIAXone (ROCEPHIN) 2 g in  sodium chloride 0.9 % 100 mL IVPB  Status:  Discontinued        2 g 200 mL/hr over 30 Minutes Intravenous Every 24 hours 10/25/21 1240 10/26/21 1135   10/25/21 2000  azithromycin (ZITHROMAX) 500 mg in sodium chloride 0.9 % 250 mL IVPB        500 mg 250 mL/hr over 60 Minutes Intravenous  Once 10/25/21 1240 10/25/21 2125   10/24/21 2100  azithromycin (ZITHROMAX) 500 mg in sodium chloride 0.9 % 250 mL IVPB        500 mg 250 mL/hr over 60 Minutes Intravenous  Once 10/24/21 2053 10/24/21 2320   10/24/21 2100  cefTRIAXone (ROCEPHIN) 1 g in sodium chloride 0.9 % 100 mL IVPB        1 g 200 mL/hr over 30 Minutes Intravenous  Once 10/24/21 2053 10/24/21 2147        Time Spent in minutes  30   Susa Raring M.D on 10/27/2021 at 11:47 AM  To page go to [email or web address]   [facility] -  Office  [phone]  See all Orders from today for further details    Objective:   Vitals:   10/27/21 0406 10/27/21 0737 10/27/21 0837 10/27/21 1146  BP: (!) 143/83 102/73 134/79 115/67  Pulse:  (!) 105  75  Resp: (!) 21 20 20  (!) 23  Temp: 98.5 F (36.9 C) 98.3 F (36.8 C)    TempSrc: Oral Oral    SpO2: 96% 99%    Weight:      Height:        Wt Readings from Last 3 Encounters:  10/25/21 61.2 kg     Intake/Output Summary (Last 24 hours) at 10/27/2021 1147 Last data filed at 10/27/2021 0700 Gross per 24 hour  Intake 2782.2 ml  Output --  Net 2782.2 ml     Physical Exam  Awake Alert, No new F.N deficits, Normal affect [place].AT, R-periorbital bruise Supple Neck, No JVD,   Symmetrical Chest wall movement, Good air movement bilaterally, CTAB RRR,No Gallops, Rubs or new Murmurs,  +ve B.Sounds, Abd Soft, No tenderness,   No Cyanosis, Clubbing or edema       Data Review:    CBC Recent Labs  Lab 10/24/21 2019 10/24/21 2251 10/26/21 0108 10/27/21 0055  WBC 7.1  --  4.6 5.4  HGB 13.2 12.2 9.9* 9.7*  HCT 37.3 36.0 28.0* 28.7*  PLT 132*  --  86* 74*  MCV 96.1  --  98.2 101.4*   MCH 34.0  --  34.7* 34.3*  MCHC 35.4  --  35.4 33.8  RDW 12.6  --  12.4 12.8  LYMPHSABS 1.1  --   --  1.4  MONOABS 0.3  --   --  0.4  EOSABS 0.0  --   --  0.1  BASOSABS 0.0  --   --  0.0    Electrolytes Recent Labs  Lab 10/24/21 2019 10/24/21 2245 10/24/21 2251 10/25/21 1431 10/25/21 1703 10/26/21  1610 10/26/21 0609 10/26/21 1531 10/27/21 0055  NA 139 137 137  --   --  136  --   --  137  K 3.1* 3.1* 3.1*  --   --  2.6*  --  3.0* 3.1*  CL 99 101  --   --   --  99  --   --  103  CO2 17* 19*  --   --   --  29  --   --  26  GLUCOSE 109* 131*  --   --   --  125*  --   --  121*  BUN 11 10  --   --   --  <5*  --   --  <5*  CREATININE 0.51 0.50  --   --   --  0.56  --   --  0.54  CALCIUM 9.0 8.8*  --   --   --  8.4*  --   --  8.5*  AST 125*  --   --   --   --   --   --   --  45*  ALT 36  --   --   --   --   --   --   --  19  ALKPHOS 223*  --   --   --   --   --   --   --  140*  BILITOT 2.6*  --   --   --   --   --   --   --  1.8*  ALBUMIN 3.9  --   --   --   --   --   --   --  2.9*  MG  --   --   --   --   --  1.2*  --  2.6* 1.7  PROCALCITON  --   --   --   --   --  <0.10  --   --  <0.10  LATICACIDVEN 6.1* 3.8*  --  1.4 1.0  --   --   --   --   INR  --  1.1  --   --   --   --   --   --   --   TSH  --   --   --   --   --   --  8.043*  --   --   BNP  --   --   --   --   --   --   --   --  154.3*    ------------------------------------------------------------------------------------------------------------------ No results for input(s): CHOL, HDL, LDLCALC, TRIG, CHOLHDL, LDLDIRECT in the last 72 hours.  No results found for: HGBA1C  Recent Labs    10/26/21 0609  TSH 8.043*   ------------------------------------------------------------------------------------------------------------------ ID Labs Recent Labs  Lab 10/24/21 2019 10/24/21 2245 10/25/21 1431 10/25/21 1703 10/26/21 0108 10/27/21 0055  WBC 7.1  --   --   --  4.6 5.4  PLT 132*  --   --   --  86* 74*   PROCALCITON  --   --   --   --  <0.10 <0.10  LATICACIDVEN 6.1* 3.8* 1.4 1.0  --   --   CREATININE 0.51 0.50  --   --  0.56 0.54   Cardiac Enzymes No results for input(s): CKMB, TROPONINI, MYOGLOBIN in the last 168 hours.  Invalid input(s): CK   Radiology Reports DG Chest 2 View  Result  Date: 10/24/2021 CLINICAL DATA:  Dyspnea, chest pain EXAM: CHEST - 2 VIEW COMPARISON:  None. FINDINGS: There is focal infiltrate within the right middle lobe, possibly infectious in the appropriate clinical setting. Minimal infiltrate may be present within the lingula. No pneumothorax or pleural effusion. Cardiac size within normal limits. 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 Pulmonary vascularity is normal. No acute bone abnormality. IMPRESSION: Right middle lobe pneumonic infiltrate. Follow-up radiograph is recommended in 3-4 weeks to document complete resolution and exclude the presence of a central obstructing lesion. Aneurysmal dilation of the thoracic aorta. This would better assessed with contrast enhanced CT examination if prior examinations are unavailable for comparison, if clinically indicated. Electronically Signed   By: Helyn Numbers M.D.   On: 10/24/2021 19:39   CT Head Wo Contrast  Result Date: 10/24/2021 CLINICAL DATA:  Fall injury. EXAM: CT HEAD WITHOUT CONTRAST TECHNIQUE: Contiguous axial images were obtained from the base of the skull through the vertex without intravenous contrast. COMPARISON:  None. FINDINGS: Brain: There is mild cerebral atrophy and atrophic ventriculomegaly with mild-to-moderate small vessel disease of the cerebral white matter and a few small chronic bilateral gangliocapsular lacunar infarcts. There is slight cerebellar atrophy. No asymmetry is seen concerning for acute infarct, hemorrhage or mass. There is no midline shift. Vascular: There are scattered calcifications in the carotid siphons but no hyperdense central vasculature.  Additional calcification distal left vertebral artery. Skull: Normal aside from osteopenia. Negative for fracture or focal lesion. No scalp hematoma is visible. Sinuses/Orbits: No acute finding. Evidence of prior lens extractions. Right-sided deviation and spurring of the nasal septum. Other: None. IMPRESSION: No acute intracranial CT findings or depressed skull fractures. Chronic change. Electronically Signed   By: Almira Bar M.D.   On: 10/24/2021 20:34   CT Chest W Contrast  Result Date: 10/25/2021 CLINICAL DATA:  Dyspnea and chest pain, suspected pneumonia on chest x-ray, with prominent aortic configuration on chest x-ray. Also recent fall injury in last 2 weeks. EXAM: CT CHEST WITH CONTRAST TECHNIQUE: Multidetector CT imaging of the chest was performed during intravenous contrast administration. CONTRAST:  OMNIPAQUE IOHEXOL 300 MG/ML  SOLN COMPARISON:  PA and lateral chest yesterday. FINDINGS: Cardiovascular: The heart is upper limits of normal in size. There are minimal to mild scattered three-vessel coronary artery calcifications with no pericardial effusion no venous distention. There is a prominent pulmonary trunk measuring 3.6 cm indicating arterial hypertension but no findings of acute right heart strain. No central embolus is seen but arterial opacification distal to the hila is very limited due to bolus timing. The aorta is nonaneurysmal but is moderately tortuous. There are mild scattered calcific plaques. The aortic root is 3.1 cm, ascending aorta is 3.2 cm, the arch measures up to 3.1 cm and the descending segment 2.9 cm. There is no dissection no great vessel stenosis. Mediastinum/Nodes: There are multiple calcified lymph nodes in the right-sided subcarinal space and right hilum. A single mildly prominent right paratracheal space lymph node measures 1.6 x 1.1 cm and there is no further noncalcified intrathoracic adenopathy. No axillary or thyroid mass is seen. Unremarkable thoracic  esophagus. Lungs/Pleura: Right chest volume loss and mild right mediastinal shift is present with cystic fibrotic changes with relative peripheral sparing in the right lower lobe, with bronchiectasis, with individual cystic spaces separated by slightly thickened septa and the cysts ranging from few mm up to 1.2 cm in size. In the right middle lobe there coarse chronic interstitial changes and  additional volume loss, without the cystic component seen in the lower lobe. There is scattered ground-glass opacity in the right middle and lower lobes which is probably part of the chronic scarring process with resolving pneumonitis also possible. In the right upper lobe apex there are multiple scattered interstitial micronodules primarily in the periphery. There is reticulated scarring at the extreme apex. Left lung is hyperinflated and clear. The central airways are patent. No pleural effusion is seen. Upper Abdomen: Liver is moderately steatotic. The gallbladder is distended without appreciable focal abnormality of the visualized portion. Musculoskeletal: There are degenerative changes and mild kyphosis of the thoracic spine. IMPRESSION: 1. Prominent pulmonary trunk. No central embolus is seen but contrast opacification very limited distal to the hila due to bolus timing. No evidence for acute right heart strain. 2. Moderately tortuous aorta but no aneurysm or dissection. Aortic atherosclerosis. 3. 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 4. Volume loss of the right lung with cystic fibrotic process and bronchiectasis with relative peripheral sparing in the right lower lobe, coarse chronic interstitial changes and additional volume loss in the right middle lobe, and scattered ground-glass opacities in the right middle and lower lobes which could be an active pneumonitis component or part of the fibrotic change. This is probably due to an old granulomatous infection given the numerous calcified right hilar nodes, and / or a  chronic inflammatory process. Interstitial micronodularity in the right upper lobe also is likely chronic, but age indeterminate without prior studies. A three-month follow-up chest CT is recommended to ensure stability. 5. Hyperinflated but otherwise clear left lung. 6. Moderate liver steatosis. 7. Distended gallbladder, incompletely visualized but no wall thickening where visible. Electronically Signed   By: Almira Bar M.D.   On: 10/25/2021 01:42   DG Chest Port 1 View  Result Date: 10/26/2021 CLINICAL DATA:  Encounter for shortness of breath EXAM: PORTABLE CHEST 1 VIEW COMPARISON:  Radiography from 2 days ago FINDINGS: Volume loss and pulmonary parenchymal opacity at the right base where there is fibrosis by recent CT. The left lung is clear. Stable heart size and aortic tortuosity. IMPRESSION: Scarring/fibrosis at the right base. No acute finding when compared to prior. Electronically Signed   By: Tiburcio Pea M.D.   On: 10/26/2021 06:43

## 2021-10-27 NOTE — Plan of Care (Signed)

## 2021-10-27 NOTE — Progress Notes (Signed)
Physical Therapy Treatment Patient Details Name: Brittany Randolph MRN: [id number] DOB: 25-Feb-1946 Today's Date: 10/27/2021   History of Present Illness 75yo female who presented on 10/24/21 with SOB, also reports a fall at home around thanksgiving. Covid negative. CTH negative for acute changes. PMH EtOH dependence, COPD, HTN, lung lobectomy    PT Comments    Patient much improved and able to ambulate independently in hallways. No further acute PT needed. Agree with OPPT due to h/o fall.    Recommendations for follow up therapy are one component of a multi-disciplinary discharge planning process, led by the attending physician.  Recommendations may be updated based on patient status, additional functional criteria and insurance authorization.  Follow Up Recommendations  Outpatient PT     Assistance Recommended at Discharge PRN  Equipment Recommendations  None recommended by PT    Recommendations for Other Services       Precautions / Restrictions Precautions Precautions: Fall     Mobility  Bed Mobility Overal bed mobility: Modified Independent                  Transfers Overall transfer level: Independent Equipment used: None Transfers: Sit to/from Stand Sit to Stand: Independent                Ambulation/Gait Ambulation/Gait assistance: Supervision;Independent Gait Distance (Feet): 350 Feet Assistive device: None Gait Pattern/deviations: WFL(Within Functional Limits)   Gait velocity interpretation: >2.62 ft/sec, indicative of community ambulatory       Stairs             Wheelchair Mobility    Modified Rankin (Stroke Patients Only)       Balance Overall balance assessment: Needs assistance;History of Falls Sitting-balance support: Feet supported;No upper extremity supported Sitting balance-Leahy Scale: Good     Standing balance support: No upper extremity supported;During functional activity Standing balance-Leahy Scale: Fair                               Cognition Arousal/Alertness: Awake/alert Behavior During Therapy: WFL for tasks assessed/performed Overall Cognitive Status: Within Functional Limits for tasks assessed                                          Exercises      General Comments General comments (skin integrity, edema, etc.): VSS on RA; on monitor while walking      Pertinent Vitals/Pain Pain Assessment: No/denies pain    Home Living                          Prior Function            PT Goals (current goals can now be found in the care plan section) Acute Rehab PT Goals Patient Stated Goal: go home once medically ready PT Goal Formulation: With patient Time For Goal Achievement: 11/08/21 Potential to Achieve Goals: Good Progress towards PT goals: Goals met/education completed, patient discharged from PT    Frequency    Min 3X/week      PT Plan Current plan remains appropriate    Co-evaluation              AM-PAC PT "6 Clicks" Mobility   Outcome Measure  Help needed turning from your back to your side while in a flat bed without  using bedrails?: None Help needed moving from lying on your back to sitting on the side of a flat bed without using bedrails?: None Help needed moving to and from a bed to a chair (including a wheelchair)?: None Help needed standing up from a chair using your arms (e.g., wheelchair or bedside chair)?: None Help needed to walk in hospital room?: None Help needed climbing 3-5 steps with a railing? : None 6 Click Score: 24    End of Session   Activity Tolerance: Patient tolerated treatment well Patient left: in chair;with call bell/phone within reach Nurse Communication: Mobility status PT Visit Diagnosis: Muscle weakness (generalized) (M62.81);Unsteadiness on feet (R26.81);History of falling (Z91.81)     Time: 9767-3419 PT Time Calculation (min) (ACUTE ONLY): 14 min  Charges:  $Gait Training: 8-22  mins                      Arby Barrette, PT Acute Rehabilitation Services  Pager [phone] Office [phone]    Rexanne Mano 10/27/2021, 3:05 PM

## 2021-10-28 LAB — CBC WITH DIFFERENTIAL/PLATELET
Abs Immature Granulocytes: 0.1 10*3/uL — ABNORMAL HIGH (ref 0.00–0.07)
Basophils Absolute: 0.1 10*3/uL (ref 0.0–0.1)
Basophils Relative: 1 %
Eosinophils Absolute: 0.2 10*3/uL (ref 0.0–0.5)
Eosinophils Relative: 4 %
HCT: 30.8 % — ABNORMAL LOW (ref 36.0–46.0)
Hemoglobin: 10.1 g/dL — ABNORMAL LOW (ref 12.0–15.0)
Immature Granulocytes: 2 %
Lymphocytes Relative: 19 %
Lymphs Abs: 1.2 10*3/uL (ref 0.7–4.0)
MCH: 33.7 pg (ref 26.0–34.0)
MCHC: 32.8 g/dL (ref 30.0–36.0)
MCV: 102.7 fL — ABNORMAL HIGH (ref 80.0–100.0)
Monocytes Absolute: 0.5 10*3/uL (ref 0.1–1.0)
Monocytes Relative: 8 %
Neutro Abs: 4.1 10*3/uL (ref 1.7–7.7)
Neutrophils Relative %: 66 %
Platelets: 85 10*3/uL — ABNORMAL LOW (ref 150–400)
RBC: 3 MIL/uL — ABNORMAL LOW (ref 3.87–5.11)
RDW: 13.3 % (ref 11.5–15.5)
WBC: 6.1 10*3/uL (ref 4.0–10.5)
nRBC: 0 % (ref 0.0–0.2)

## 2021-10-28 LAB — COMPREHENSIVE METABOLIC PANEL
ALT: 21 U/L (ref 0–44)
AST: 42 U/L — ABNORMAL HIGH (ref 15–41)
Albumin: 3 g/dL — ABNORMAL LOW (ref 3.5–5.0)
Alkaline Phosphatase: 157 U/L — ABNORMAL HIGH (ref 38–126)
Anion gap: 9 (ref 5–15)
BUN: <5 mg/dL — ABNORMAL LOW (ref 8–23)
CO2: 30 mmol/L (ref 22–32)
Calcium: 8.7 mg/dL — ABNORMAL LOW (ref 8.9–10.3)
Chloride: 98 mmol/L (ref 98–111)
Creatinine, Ser: 0.59 mg/dL (ref 0.44–1.00)
GFR, Estimated: >60 mL/min (ref >60)
Glucose, Bld: 117 mg/dL — ABNORMAL HIGH (ref 70–99)
Potassium: 3.3 mmol/L — ABNORMAL LOW (ref 3.5–5.1)
Sodium: 137 mmol/L (ref 135–145)
Total Bilirubin: 1.3 mg/dL — ABNORMAL HIGH (ref 0.3–1.2)
Total Protein: 6.1 g/dL — ABNORMAL LOW (ref 6.5–8.1)

## 2021-10-28 LAB — MAGNESIUM: Magnesium: 1.7 mg/dL (ref 1.7–2.4)

## 2021-10-28 LAB — BRAIN NATRIURETIC PEPTIDE: B Natriuretic Peptide: 132.3 pg/mL — ABNORMAL HIGH (ref 0.0–100.0)

## 2021-10-28 LAB — PHOSPHORUS: Phosphorus: 3.7 mg/dL (ref 2.5–4.6)

## 2021-10-28 LAB — PROCALCITONIN: Procalcitonin: <0.1 ng/mL

## 2021-10-28 MED ORDER — POTASSIUM CHLORIDE CRYS ER 20 MEQ PO TBCR
20.0000 meq | EXTENDED_RELEASE_TABLET | Freq: Once | ORAL | Status: AC
  Administered 2021-10-28: 20 meq via ORAL
  Filled 2021-10-28: qty 1

## 2021-10-28 MED ORDER — MAGNESIUM OXIDE -MG SUPPLEMENT 400 (240 MG) MG PO TABS
800.0000 mg | ORAL_TABLET | Freq: Once | ORAL | Status: AC
  Administered 2021-10-28: 800 mg via ORAL
  Filled 2021-10-28: qty 2

## 2021-10-28 MED ORDER — FOLIC ACID 1 MG PO TABS: 1.0000 mg | ORAL_TABLET | Freq: Every day | ORAL | 0 refills | Status: AC

## 2021-10-28 MED ORDER — MAGNESIUM SULFATE IN D5W 1-5 GM/100ML-% IV SOLN
1.0000 g | Freq: Once | INTRAVENOUS | Status: DC
  Filled 2021-10-28: qty 100

## 2021-10-28 MED ORDER — CYANOCOBALAMIN 1000 MCG PO TABS
1000.0000 ug | ORAL_TABLET | Freq: Every day | ORAL | 0 refills | Status: AC
Start: 2021-11-02 — End: ?

## 2021-10-28 MED ORDER — THIAMINE HCL 100 MG PO TABS: 100.0000 mg | ORAL_TABLET | Freq: Every day | ORAL | 0 refills | Status: AC

## 2021-10-28 MED ORDER — POTASSIUM CHLORIDE CRYS ER 20 MEQ PO TBCR
40.0000 meq | EXTENDED_RELEASE_TABLET | Freq: Once | ORAL | Status: AC
  Administered 2021-10-28: 40 meq via ORAL
  Filled 2021-10-28: qty 2

## 2021-10-28 MED ORDER — CARVEDILOL 3.125 MG PO TABS
3.1250 mg | ORAL_TABLET | Freq: Two times a day (BID) | ORAL | 0 refills | Status: AC
Start: 2021-10-28 — End: ?

## 2021-10-28 NOTE — Progress Notes (Signed)
Occupational Therapy Treatment Patient Details Name: Brittany Randolph MRN: [id number] DOB: [birth year] Today's Date: 10/28/2021   History of present illness 75yo female who presented on 10/24/21 with SOB, also reports a fall at home around thanksgiving. Covid negative. CTH negative for acute changes. PMH EtOH dependence, COPD, HTN, lung lobectomy   OT comments  Pt making good progress with functional goals. Pt able to stand from EOB to walk to bathroom with HHA initially, then Sup with toilet transfers, toileting, stepping in and out of shower using grab bar, washing hands and face standing at sink, donning gown standing and donning socks. Pt states that she is ready to go home   Recommendations for follow up therapy are one component of a multi-disciplinary discharge planning process, led by the attending physician.  Recommendations may be updated based on patient status, additional functional criteria and insurance authorization.    Follow Up Recommendations  No OT follow up    Assistance Recommended at Discharge PRN  Equipment Recommendations  None recommended by OT    Recommendations for Other Services      Precautions / Restrictions Precautions Precautions: Fall Restrictions Weight Bearing Restrictions: No       Mobility Bed Mobility Overal bed mobility: Modified Independent                  Transfers Overall transfer level: Independent Equipment used: 1 person hand held assist;None Transfers: Sit to/from Stand Sit to Stand: Supervision;Independent           General transfer comment: S for safety, HH physical assist initially to walk from bed to bathroom     Balance Overall balance assessment: Needs assistance;History of Falls Sitting-balance support: Feet supported;No upper extremity supported Sitting balance-Leahy Scale: Good     Standing balance support: No upper extremity supported;During functional activity Standing balance-Leahy Scale: Good                              ADL either performed or assessed with clinical judgement   ADL Overall ADL's : Needs assistance/impaired     Grooming: Supervision/safety;Standing;Wash/dry hands;Wash/dry face           Upper Body Dressing : Supervision/safety;Standing   Lower Body Dressing: Sit to/from stand;Supervision/safety   Toilet Transfer: Ambulation;Supervision/safety   Toileting- Clothing Manipulation and Hygiene: Sit to/from stand;Supervision/safety   Tub/ Shower Transfer: Supervision/safety;Ambulation;Grab bars   Functional mobility during ADLs: Supervision/safety      Extremity/Trunk Assessment Upper Extremity Assessment Upper Extremity Assessment: Generalized weakness       Cervical / Trunk Assessment Cervical / Trunk Assessment: Normal    Vision Baseline Vision/History: 1 Wears glasses Ability to See in Adequate Light: 0 Adequate Patient Visual Report: No change from baseline     Perception     Praxis      Cognition Arousal/Alertness: Awake/alert Behavior During Therapy: WFL for tasks assessed/performed Overall Cognitive Status: Within Functional Limits for tasks assessed                                            Exercises     Shoulder Instructions       General Comments      Pertinent Vitals/ Pain       Pain Assessment: No/denies pain Pain Intervention(s): Monitored during session  Home Living  Prior Functioning/Environment              Frequency  Min 2X/week        Progress Toward Goals  OT Goals(current goals can now be found in the care plan section)  Progress towards OT goals: Progressing toward goals  Acute Rehab OT Goals Patient Stated Goal: "to go home"  Plan Discharge plan remains appropriate    Co-evaluation                 AM-PAC OT "6 Clicks" Daily Activity     Outcome Measure   Help from another person eating meals?:  None Help from another person taking care of personal grooming?: A Little Help from another person toileting, which includes using toliet, bedpan, or urinal?: A Little Help from another person bathing (including washing, rinsing, drying)?: A Little Help from another person to put on and taking off regular upper body clothing?: A Little Help from another person to put on and taking off regular lower body clothing?: A Little 6 Click Score: 19    End of Session    OT Visit Diagnosis: Unsteadiness on feet (R26.81);History of falling (Z91.81)   Activity Tolerance Patient tolerated treatment well   Patient Left with call bell/phone within reach;with nursing/sitter in room;in bed (sitting EOB)   Nurse Communication          Time: 5631-4970 OT Time Calculation (min): 25 min  Charges: OT General Charges $OT Visit: 1 Visit OT Treatments $Self Care/Home Management : 8-22 mins $Therapeutic Activity: 8-22 mins    Galen Manila 10/28/2021, 12:05 PM

## 2021-10-28 NOTE — Plan of Care (Signed)

## 2021-10-28 NOTE — Progress Notes (Signed)
Pt has got 5d of azith. Ok to stop at this point.   Ulyses Southward, PharmD, BCIDP, AAHIVP, CPP Infectious Disease Pharmacist 10/28/2021 9:13 AM

## 2021-10-28 NOTE — Discharge Instructions (Signed)
Follow with Primary MD Bulla, Donald, PA-C in 7 days   Get CBC, CMP, 2 view Chest X ray -  checked next visit within 1 week by Primary MD    Activity: As tolerated with Full fall precautions use walker/cane & assistance as needed  Disposition Home    Diet: Heart Healthy   Special Instructions: If you have smoked or chewed Tobacco  in the last 2 yrs please stop smoking, stop any regular Alcohol  and or any Recreational drug use.  On your next visit with your primary care physician please Get Medicines reviewed and adjusted.  Please request your Prim.MD to go over all Hospital Tests and Procedure/Radiological results at the follow up, please get all Hospital records sent to your Prim MD by signing hospital release before you go home.  If you experience worsening of your admission symptoms, develop shortness of breath, life threatening emergency, suicidal or homicidal thoughts you must seek medical attention immediately by calling 911 or calling your MD immediately  if symptoms less severe.  You Must read complete instructions/literature along with all the possible adverse reactions/side effects for all the Medicines you take and that have been prescribed to you. Take any new Medicines after you have completely understood and accpet all the possible adverse reactions/side effects.

## 2021-10-28 NOTE — Discharge Summary (Signed)
Brittany Randolph WGY:[id number] DOB: 1946/08/24 DOA: 10/24/2021  PCP: Doreen Salvage, PA-C  Admit date: 10/24/2021  Discharge date: 10/28/2021  Admitted From: Home   Disposition:  Home   Recommendations for Outpatient Follow-up:   Follow up with PCP in 1-2 weeks  PCP Please obtain BMP/CBC, 2 view CXR in 1week,  (see Discharge instructions)   PCP Please follow up on the following pending results: Check CBC, CMP, Mag, 2 view chest x-ray by her PCP in 7 to 10 days.  Needs outpatient pulmonary follow-up for nonspecific CT chest findings, also will benefit from close monitoring of her tortuous thoracic aorta.   Home Health: Pt   Equipment/Devices: as below  Consultations: None  Discharge Condition: Stable    CODE STATUS: Full    Diet Recommendation: Heart Healthy   Diet Order             Diet - low sodium heart healthy           Diet regular Room service appropriate? Yes with Assist; Fluid consistency: Thin  Diet effective now                    Chief Complaint  Patient presents with   Chest Pain   Fall   Shortness of Breath     Brief history of present illness from the day of admission and additional interim summary    Brittany Randolph is a 75 y.o. female with medical history significant of COPD; HTN; and ETOH dependence presenting to [facility] with SOB. She reports that around Thanksgiving, she tripped over a slipper in the floor and hit her R eye on the corner of the end table.  She also hit her R chest on a stool, she was found to have a large R. Eye hematoma, apparently family was visiting her and found to be intoxicated and sleepy, they also noticed a large hematoma around the right eye, she told the family that she was recovering from a chest infection which she had a week ago, they did not found her to be  short of breath but did notice a cough.  She was brought to the hospital where she was found to have a large right hematoma, severe dehydration with severe hypokalemia and hypomagnesemia and she was admitted.                                                                 Hospital Course   Acute Alcohol intoxication with poor oral intake caused severe dehydration, hypokalemia and hypomagnesemia - was adequately hydrated with IV fluids, electrolytes have been adequately replaced, she has been extensively counseled to quit drinking alcohol, was on Librium along with CIWA protocol no DTs. Seen by PT OT, now close to baseline will be discharged home with PT  OT.   2.  Nonspecific CT chest findings.  Likely chronic infection, treated with 5 days of azithromycin.  Outpatient pulmonary follow-up within a month of discharge for repeat CT.   3.  Hypertension. Placed on Coreg & continue with as needed Catapres combination.   4.  Thrombocytopenia.  Likely undiagnosed alcoholic cirrhosis.  Monitor.   5.  B12 deficiency.  Replace.   6.  Tortuous thoracic aorta.  Placed on beta-blocker outpatient monitoring by PCP.   Discharge diagnosis     Principal Problem:   Dyspnea Active Problems:   Essential hypertension   Alcohol dependence (HCC)   Failure to thrive in adult    Discharge instructions    Discharge Instructions     Ambulatory referral to Physical Therapy   Complete by: As directed    Diet - low sodium heart healthy   Complete by: As directed    Discharge instructions   Complete by: As directed    Follow with Primary MD Bulla, Donald, PA-C in 7 days   Get CBC, CMP, 2 view Chest X ray -  checked next visit within 1 week by Primary MD    Activity: As tolerated with Full fall precautions use walker/cane & assistance as needed  Disposition Home    Diet: Heart Healthy   Special Instructions: If you have smoked or chewed Tobacco  in the last 2 yrs please stop smoking, stop any regular  Alcohol  and or any Recreational drug use.  On your next visit with your primary care physician please Get Medicines reviewed and adjusted.  Please request your Prim.MD to go over all Hospital Tests and Procedure/Radiological results at the follow up, please get all Hospital records sent to your Prim MD by signing hospital release before you go home.  If you experience worsening of your admission symptoms, develop shortness of breath, life threatening emergency, suicidal or homicidal thoughts you must seek medical attention immediately by calling 911 or calling your MD immediately  if symptoms less severe.  You Must read complete instructions/literature along with all the possible adverse reactions/side effects for all the Medicines you take and that have been prescribed to you. Take any new Medicines after you have completely understood and accpet all the possible adverse reactions/side effects.   Increase activity slowly   Complete by: As directed        Discharge Medications   Allergies as of 10/28/2021       Reactions   Codeine Nausea And Vomiting        Medication List     STOP taking these medications    amLODipine 5 MG tablet Commonly known as: NORVASC       TAKE these medications    acetaminophen 500 MG tablet Commonly known as: TYLENOL Take 500-1,000 mg by mouth every 6 (six) hours as needed for pain.   aspirin 81 MG EC tablet Take 81 mg by mouth daily.   carvedilol 3.125 MG tablet Commonly known as: COREG Take 1 tablet (3.125 mg total) by mouth 2 (two) times daily with a meal.   citalopram 20 MG tablet Commonly known as: CELEXA Take 20 mg by mouth daily.   cyanocobalamin 1000 MCG tablet Take 1 tablet (1,000 mcg total) by mouth daily. Start taking on: November 02, 2021   folic acid 1 MG tablet Commonly known as: FOLVITE Take 1 tablet (1 mg total) by mouth daily.   gabapentin 400 MG capsule Commonly known as: NEURONTIN Take 400 mg by mouth 3  (  three) times daily.   glucosamine-chondroitin 500-400 MG tablet Take 1 tablet by mouth 3 (three) times daily.   minocycline 50 MG capsule Commonly known as: MINOCIN Take 50 mg by mouth daily.   Oyster Shell Calcium/D 500-5 MG-MCG Tabs Take 1 tablet by mouth 2 (two) times daily with a meal.   pravastatin 20 MG tablet Commonly known as: PRAVACHOL Take 20 mg by mouth daily.   QUEtiapine 100 MG tablet Commonly known as: SEROQUEL Take 100 mg by mouth daily.   thiamine 100 MG tablet Take 1 tablet (100 mg total) by mouth daily. Start taking on: October 29, 2021               Durable Medical Equipment  (From admission, onward)           Start     Ordered   10/28/21 4098  For home use only DME Walker rolling  Once       Comments: 5 wheel  Question Answer Comment  Walker: With 5 Inch Wheels   Patient needs a walker to treat with the following condition Weakness      10/28/21 0712             Follow-up Information     Bulla, Dorinda Hill, PA-C. Schedule an appointment as soon as possible for a visit in 1 week(s).   Specialty: Internal Medicine Contact information: [address] [place] [address] [phone]         Steffanie Dunn, DO. Schedule an appointment as soon as possible for a visit in 2 week(s).   Specialty: Pulmonary Disease Contact information: [address] [place] [address] [phone]                 Major procedures and Radiology Reports - PLEASE review detailed and final reports thoroughly  -       DG Chest 2 View  Result Date: 10/24/2021 CLINICAL DATA:  Dyspnea, chest pain EXAM: CHEST - 2 VIEW COMPARISON:  None. FINDINGS: There is focal infiltrate within the right middle lobe, possibly infectious in the appropriate clinical setting. Minimal infiltrate may be present within the lingula. No pneumothorax or pleural effusion. Cardiac size within normal limits. 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 Pulmonary vascularity is normal. No acute bone abnormality. IMPRESSION: Right middle lobe pneumonic infiltrate. Follow-up radiograph is recommended in 3-4 weeks to document complete resolution and exclude the presence of a central obstructing lesion. Aneurysmal dilation of the thoracic aorta. This would better assessed with contrast enhanced CT examination if prior examinations are unavailable for comparison, if clinically indicated. Electronically Signed   By: Helyn Numbers M.D.   On: 10/24/2021 19:39   CT Head Wo Contrast  Result Date: 10/24/2021 CLINICAL DATA:  Fall injury. EXAM: CT HEAD WITHOUT CONTRAST TECHNIQUE: Contiguous axial images were obtained from the base of the skull through the vertex without intravenous contrast. COMPARISON:  None. FINDINGS: Brain: There is mild cerebral atrophy and atrophic ventriculomegaly with mild-to-moderate small vessel disease of the cerebral white matter and a few small chronic bilateral gangliocapsular lacunar infarcts. There is slight cerebellar atrophy. No asymmetry is seen concerning for acute infarct, hemorrhage or mass. There is no midline shift. Vascular: There are scattered calcifications in the carotid siphons but no hyperdense central vasculature. Additional calcification distal left vertebral artery. Skull: Normal aside from osteopenia. Negative for fracture or focal lesion. No scalp hematoma is visible. Sinuses/Orbits: No acute finding. Evidence  of prior lens extractions. Right-sided deviation and spurring of the nasal septum. Other: None. IMPRESSION: No acute intracranial CT findings or depressed skull fractures. Chronic change. Electronically Signed   By: Almira Bar M.D.   On: 10/24/2021 20:34   CT Chest W Contrast  Result Date: 10/25/2021 CLINICAL DATA:  Dyspnea and chest pain, suspected pneumonia on chest x-ray, with prominent aortic configuration on chest x-ray. Also recent fall injury in last 2 weeks. EXAM: CT CHEST WITH  CONTRAST TECHNIQUE: Multidetector CT imaging of the chest was performed during intravenous contrast administration. CONTRAST:  OMNIPAQUE IOHEXOL 300 MG/ML  SOLN COMPARISON:  PA and lateral chest yesterday. FINDINGS: Cardiovascular: The heart is upper limits of normal in size. There are minimal to mild scattered three-vessel coronary artery calcifications with no pericardial effusion no venous distention. There is a prominent pulmonary trunk measuring 3.6 cm indicating arterial hypertension but no findings of acute right heart strain. No central embolus is seen but arterial opacification distal to the hila is very limited due to bolus timing. The aorta is nonaneurysmal but is moderately tortuous. There are mild scattered calcific plaques. The aortic root is 3.1 cm, ascending aorta is 3.2 cm, the arch measures up to 3.1 cm and the descending segment 2.9 cm. There is no dissection no great vessel stenosis. Mediastinum/Nodes: There are multiple calcified lymph nodes in the right-sided subcarinal space and right hilum. A single mildly prominent right paratracheal space lymph node measures 1.6 x 1.1 cm and there is no further noncalcified intrathoracic adenopathy. No axillary or thyroid mass is seen. Unremarkable thoracic esophagus. Lungs/Pleura: Right chest volume loss and mild right mediastinal shift is present with cystic fibrotic changes with relative peripheral sparing in the right lower lobe, with bronchiectasis, with individual cystic spaces separated by slightly thickened septa and the cysts ranging from few mm up to 1.2 cm in size. In the right middle lobe there coarse chronic interstitial changes and additional volume loss, without the cystic component seen in the lower lobe. There is scattered ground-glass opacity in the right middle and lower lobes which is probably part of the chronic scarring process with resolving pneumonitis also possible. In the right upper lobe apex there are multiple scattered  interstitial micronodules primarily in the periphery. There is reticulated scarring at the extreme apex. Left lung is hyperinflated and clear. The central airways are patent. No pleural effusion is seen. Upper Abdomen: Liver is moderately steatotic. The gallbladder is distended without appreciable focal abnormality of the visualized portion. Musculoskeletal: There are degenerative changes and mild kyphosis of the thoracic spine. IMPRESSION: 1. Prominent pulmonary trunk. No central embolus is seen but contrast opacification very limited distal to the hila due to bolus timing. No evidence for acute right heart strain. 2. Moderately tortuous aorta but no aneurysm or dissection. Aortic atherosclerosis. 3. 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 4. Volume loss of the right lung with cystic fibrotic process and bronchiectasis with relative peripheral sparing in the right lower lobe, coarse chronic interstitial changes and additional volume loss in the right middle lobe, and scattered ground-glass opacities in the right middle and lower lobes which could be an active pneumonitis component or part of the fibrotic change. This is probably due to an old granulomatous infection given the numerous calcified right hilar nodes, and / or a chronic inflammatory process. Interstitial micronodularity in the right upper lobe also is likely chronic, but age indeterminate without prior studies. A three-month follow-up chest CT is recommended to ensure stability. 5. Hyperinflated  but otherwise clear left lung. 6. Moderate liver steatosis. 7. Distended gallbladder, incompletely visualized but no wall thickening where visible. Electronically Signed   By: Almira Bar M.D.   On: 10/25/2021 01:42   DG Chest Port 1 View  Result Date: 10/26/2021 CLINICAL DATA:  Encounter for shortness of breath EXAM: PORTABLE CHEST 1 VIEW COMPARISON:  Radiography from 2 days ago FINDINGS: Volume loss and pulmonary parenchymal opacity at the right base  where there is fibrosis by recent CT. The left lung is clear. Stable heart size and aortic tortuosity. IMPRESSION: Scarring/fibrosis at the right base. No acute finding when compared to prior. Electronically Signed   By: Tiburcio Pea M.D.   On: 10/26/2021 06:43     Today   Subjective    Vennessa Affinito today has no headache,no chest abdominal pain,no new weakness tingling or numbness, feels much better wants to go home today.    Objective   Blood pressure (!) 121/92, pulse 92, temperature 98.2 F (36.8 C), temperature source Oral, resp. rate 19, height 5\' 4"  (1.626 m), weight 61.2 kg, SpO2 97 %.   Intake/Output Summary (Last 24 hours) at 10/28/2021 0942 Last data filed at 10/27/2021 1900 Gross per 24 hour  Intake 812.5 ml  Output --  Net 812.5 ml    Exam  Awake Alert, No new F.N deficits, Normal affect [place].AT, right periorbital hematoma much better Supple Neck,No JVD, No cervical lymphadenopathy appriciated.  Symmetrical Chest wall movement, Good air movement bilaterally, CTAB RRR,No Gallops,Rubs or new Murmurs, No Parasternal Heave +ve B.Sounds, Abd Soft, Non tender, No organomegaly appriciated, No rebound -guarding or rigidity. No Cyanosis, Clubbing or edema, No new Rash or bruise   Data Review   CBC w Diff:  Lab Results  Component Value Date   WBC 6.1 10/28/2021   HGB 10.1 (L) 10/28/2021   HCT 30.8 (L) 10/28/2021   PLT 85 (L) 10/28/2021   LYMPHOPCT 19 10/28/2021   MONOPCT 8 10/28/2021   EOSPCT 4 10/28/2021   BASOPCT 1 10/28/2021    CMP:  Lab Results  Component Value Date   NA 137 10/28/2021   K 3.3 (L) 10/28/2021   CL 98 10/28/2021   CO2 30 10/28/2021   BUN <5 (L) 10/28/2021   CREATININE 0.59 10/28/2021   PROT 6.1 (L) 10/28/2021   ALBUMIN 3.0 (L) 10/28/2021   BILITOT 1.3 (H) 10/28/2021   ALKPHOS 157 (H) 10/28/2021   AST 42 (H) 10/28/2021   ALT 21 10/28/2021  .   Total Time in preparing paper work, data evaluation and todays exam - 35  minutes  14/05/2021 M.D on 10/28/2021 at 9:42 AM  [facility]

## 2021-10-28 NOTE — TOC Transition Note (Signed)
Transition of Care [facility]) - CM/SW Discharge Note   Patient Details  Name: JAMISON YUHASZ MRN: [id number] Date of Birth: [birth year]  Transition of Care [facility]) CM/SW Contact:  Lawerance Sabal, RN Phone Number: 10/28/2021, 11:34 AM   Clinical Narrative:    Sherron Monday w patient and discussed MD recommendations for home health. She states that her dogs are an issue for home health, she wold prefer to go to outpatient therapy and keep what has been set up. Discussed DME and she does not want a RW      Barriers to Discharge: Continued Medical Work up   Patient Goals and CMS Choice Patient states their goals for this hospitalization and ongoing recovery are:: return home CMS Medicare.gov Compare Post Acute Care list provided to:: Patient Choice offered to / list presented to : Patient  Discharge Placement                       Discharge Plan and Services                                     Social Determinants of Health (SDOH) Interventions     Readmission Risk Interventions No flowsheet data found.

## 2021-10-28 NOTE — Care Management Important Message (Signed)
Important Message  Patient Details  Name: Brittany Randolph MRN: [id number] Date of Birth: 1946/07/29   Medicare Important Message Given:  Yes     Dorena Bodo 10/28/2021, 2:01 PM

## 2021-10-28 NOTE — Progress Notes (Signed)
New med orders given , AVS explained to patient. Pt. Waiting for lunch and ride.

## 2021-10-29 LAB — CULTURE, BLOOD (ROUTINE X 2)
Culture: NO GROWTH
Special Requests: ADEQUATE

## 2021-11-03 ENCOUNTER — Telehealth: Payer: Self-pay

## 2021-11-03 NOTE — Telephone Encounter (Signed)
Patient scheduled with Dr. Judeth Horn. Close encounter.

## 2021-11-03 NOTE — Telephone Encounter (Signed)
-----   Message from Chi Mechele Collin, MD sent at 10/25/2021 10:11 AM EST ----- Regarding: Schedule for new patient Schedule as a new patient with me for Bronchiectasis. Abnormal CT. Seen in ED 10/25/21. Bronchiectasis. Possible aspiration hx.

## 2021-11-03 NOTE — Telephone Encounter (Signed)
Looks like this patient is already scheduled with Dr. Judeth Horn. Is that ok?  Next Appt With Pulmonology Karren Burly, MD) 11/17/2021 at 3:30 PM

## 2021-11-17 ENCOUNTER — Inpatient Hospital Stay: Payer: Medicare Other | Admitting: Pulmonary Disease

## 2023-05-05 IMAGING — CT CT CHEST W/ CM
2 of 3 series · 14 of 36 positions shown, 17 images · IV contrast (omnipaque)
Comparison: PA and lateral chest yesterday.

CLINICAL DATA: Dyspnea and chest pain, suspected pneumonia on chest
x-ray, with prominent aortic configuration on chest x-ray. Also
recent fall injury in last 2 weeks.

EXAM:
CT CHEST WITH CONTRAST
TECHNIQUE: Multidetector CT imaging of the chest was performed during
intravenous contrast administration.
CONTRAST:  100mL OMNIPAQUE IOHEXOL 300 MG/ML  SOLN

[Series 2: axial st · axial · 0.78mm/px · z∈[-150,+120]mm · 11 of 159 slices shown, 14 images]
[im 12/159  mediastinal]
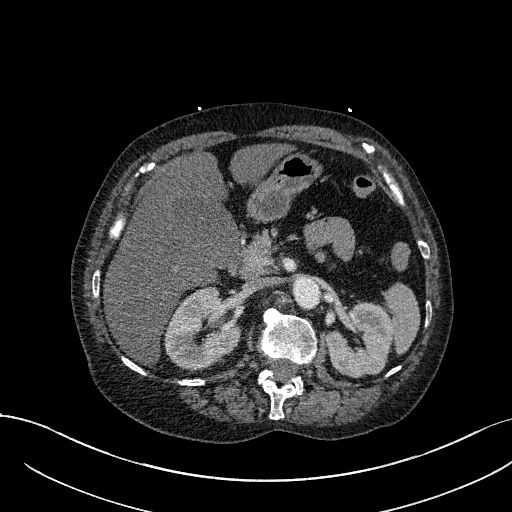
[im 12/159  lung]
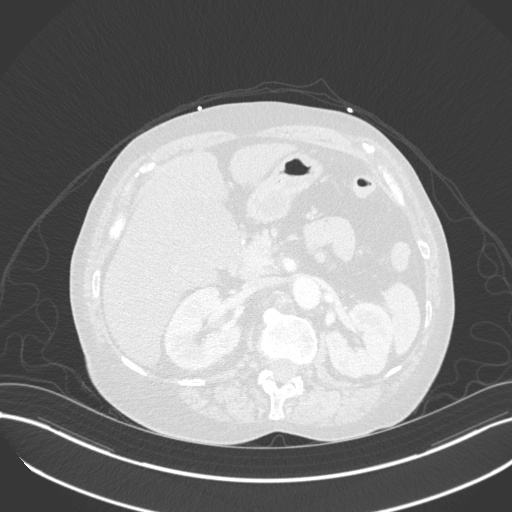
[im 24/159  lung]
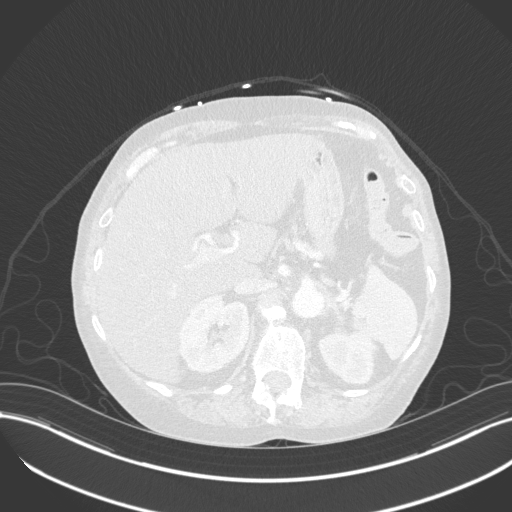
[im 36/159  lung]
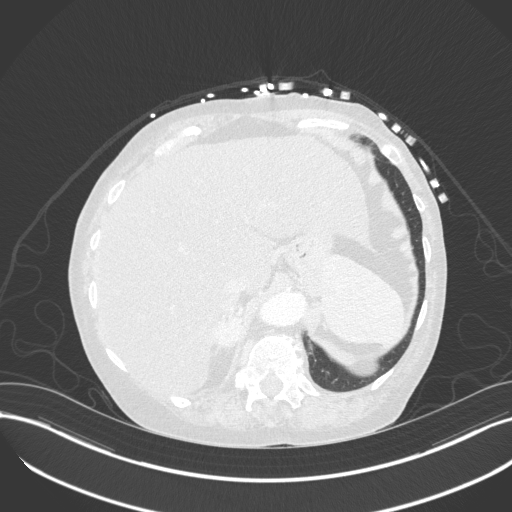
[im 53/159  lung]
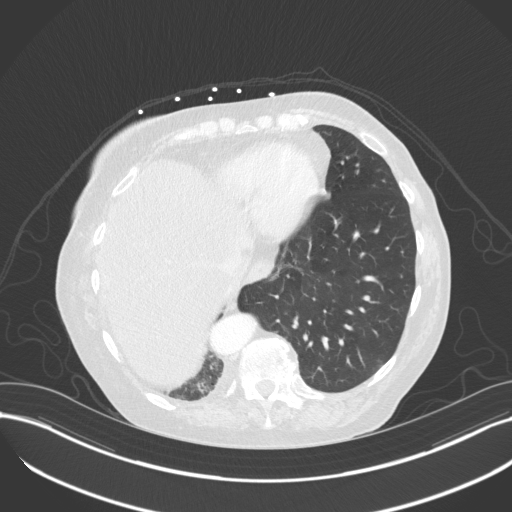
[im 65/159  mediastinal]
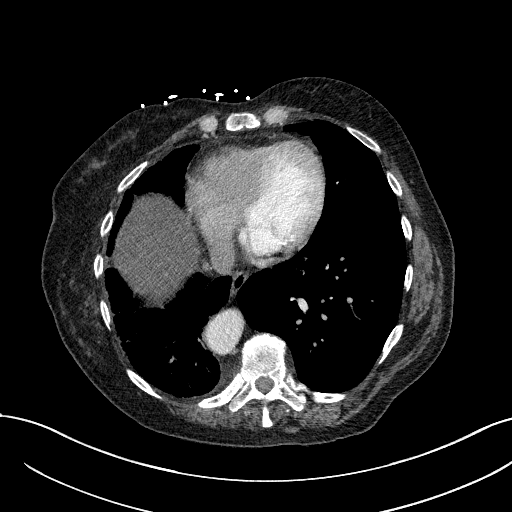
[im 65/159  lung]
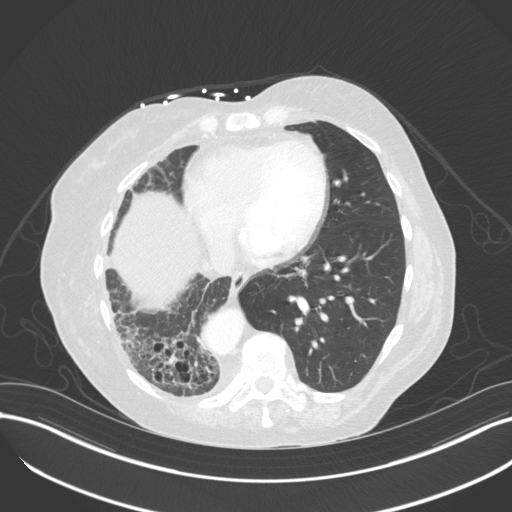
[im 82/159  lung]
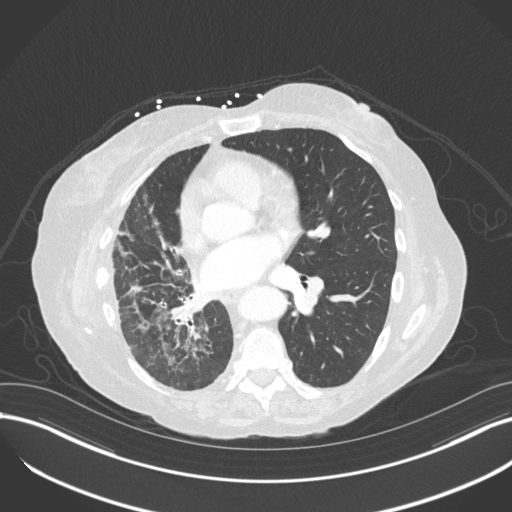
[im 94/159  lung]
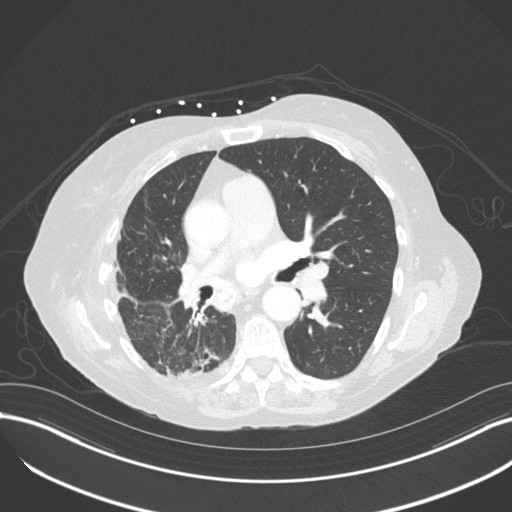
[im 106/159  lung]
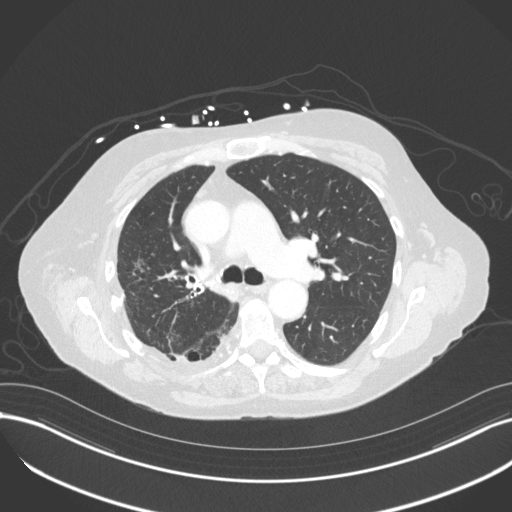
[im 123/159  mediastinal]
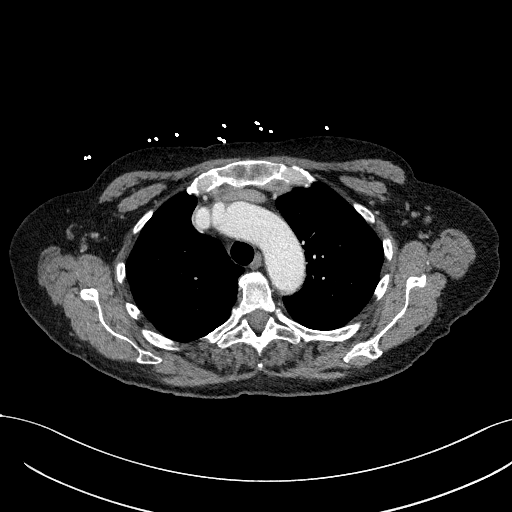
[im 123/159  lung]
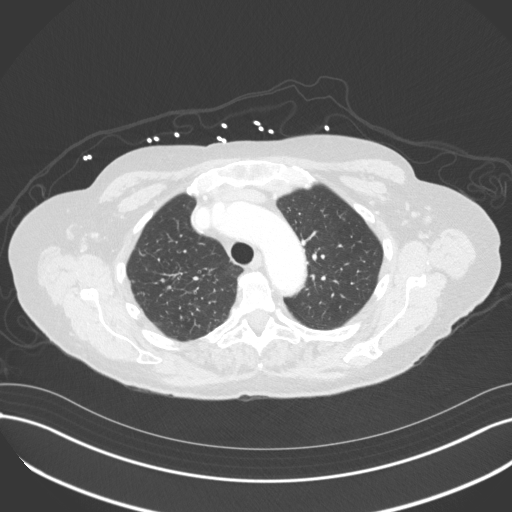
[im 135/159  lung]
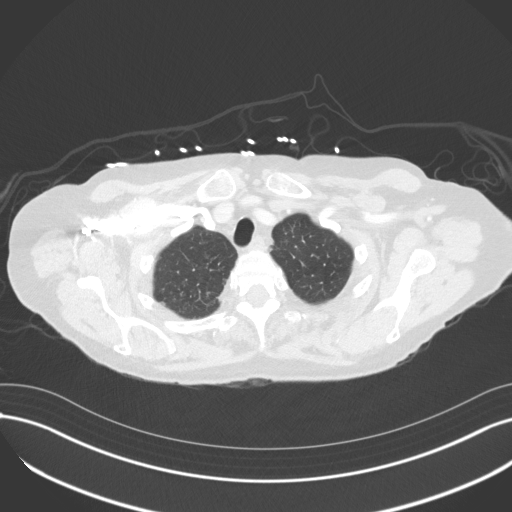
[im 147/159  lung]
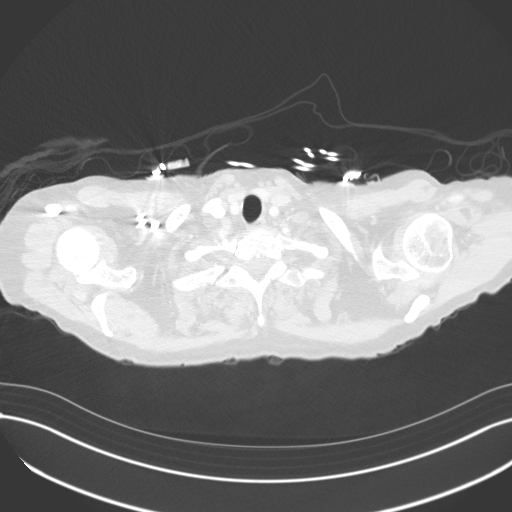

[Series 5: coronal · coronal · 0.62mm/px · 3 of 150 slices shown]
[im 30/150  lung]
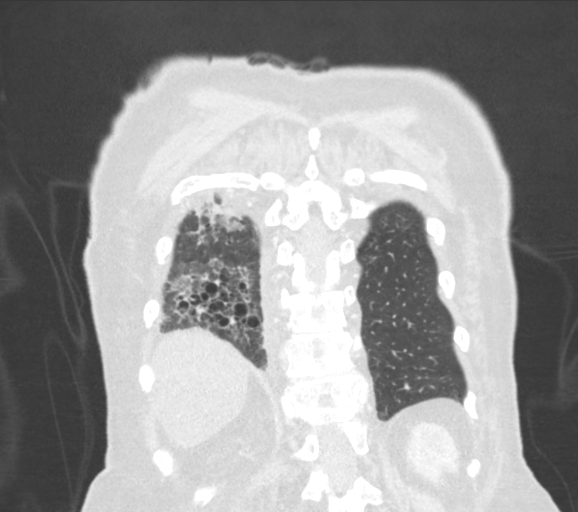
[im 60/150  lung]
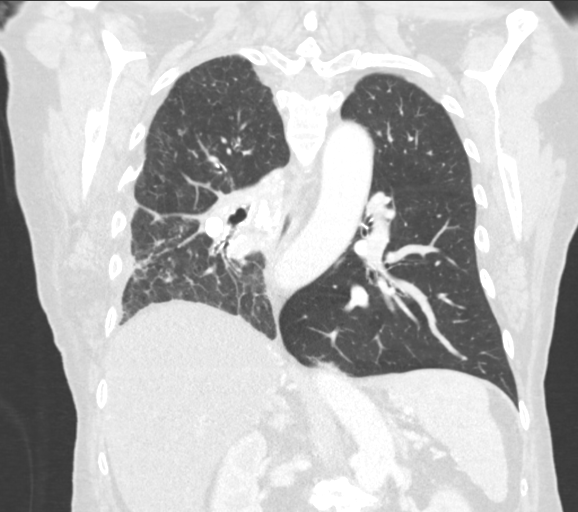
[im 90/150  lung]
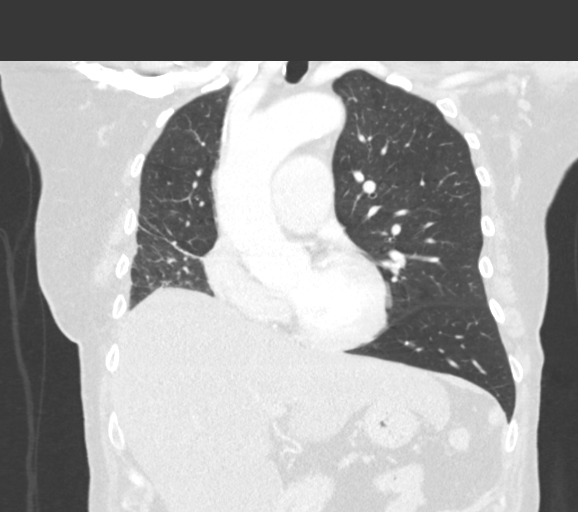

[14 of 36 positions shown; findings below may reference images not displayed]

FINDINGS: Cardiovascular: The heart is upper limits of normal in size. There
are minimal to mild scattered three-vessel coronary artery
calcifications with no pericardial effusion no venous distention.
There is a prominent pulmonary trunk measuring 3.6 cm indicating
arterial hypertension but no findings of acute right heart strain.
No central embolus is seen but arterial opacification distal to the
hila is very limited due to bolus timing.

The aorta is nonaneurysmal but is moderately tortuous. There are
mild scattered calcific plaques. The aortic root is 3.1 cm,
ascending aorta is 3.2 cm, the arch measures up to 3.1 cm and the
descending segment 2.9 cm. There is no dissection no great vessel
stenosis.

Mediastinum/Nodes: There are multiple calcified lymph nodes in the
right-sided subcarinal space and right hilum. A single mildly
prominent right paratracheal space lymph node measures 1.6 x 1.1 cm
and there is no further noncalcified intrathoracic adenopathy. No
axillary or thyroid mass is seen. Unremarkable thoracic esophagus.

Lungs/Pleura: Right chest volume loss and mild right mediastinal
shift is present with cystic fibrotic changes with relative
peripheral sparing in the right lower lobe, with bronchiectasis,
with individual cystic spaces separated by slightly thickened septa
and the cysts ranging from few mm up to 1.2 cm in size.

In the right middle lobe there coarse chronic interstitial changes
and additional volume loss, without the cystic component seen in the
lower lobe. There is scattered ground-glass opacity in the right
middle and lower lobes which is probably part of the chronic
scarring process with resolving pneumonitis also possible.

In the right upper lobe apex there are multiple scattered
interstitial micronodules primarily in the periphery. There is
reticulated scarring at the extreme apex. Left lung is hyperinflated
and clear. The central airways are patent. No pleural effusion is
seen.

Upper Abdomen: Liver is moderately steatotic. The gallbladder is
distended without appreciable focal abnormality of the visualized
portion.

Musculoskeletal: There are degenerative changes and mild kyphosis of
the thoracic spine.
IMPRESSION: 1. Prominent pulmonary trunk. No central embolus is seen but
contrast opacification very limited distal to the hila due to bolus
timing. No evidence for acute right heart strain.
2. Moderately tortuous aorta but no aneurysm or dissection. Aortic
atherosclerosis.
3. Minimal to mild scattered calcific CAD.
4. Volume loss of the right lung with cystic fibrotic process and
bronchiectasis with relative peripheral sparing in the right lower
lobe, coarse chronic interstitial changes and additional volume loss
in the right middle lobe, and scattered ground-glass opacities in
the right middle and lower lobes which could be an active
pneumonitis component or part of the fibrotic change. This is
probably due to an old granulomatous infection given the numerous
calcified right hilar nodes, and / or a chronic inflammatory
process. Interstitial micronodularity in the right upper lobe also
is likely chronic, but age indeterminate without prior studies. A
three-month follow-up chest CT is recommended to ensure stability.
5. Hyperinflated but otherwise clear left lung.
6. Moderate liver steatosis.
7. Distended gallbladder, incompletely visualized but no wall
thickening where visible.
